# Patient Record
Sex: Male | Born: 1935 | Hispanic: Yes | Marital: Married | Smoking: Never smoker
Health system: Southern US, Community
[De-identification: ages and names within clinical notes are randomized; demographics above are authoritative.]

## PROBLEM LIST (undated history)

## (undated) DIAGNOSIS — I10 Essential (primary) hypertension: Secondary | ICD-10-CM

## (undated) DIAGNOSIS — F419 Anxiety disorder, unspecified: Secondary | ICD-10-CM

## (undated) HISTORY — PX: NO PAST SURGERIES: SHX2092

---

## 2018-08-29 ENCOUNTER — Encounter (HOSPITAL_COMMUNITY): Payer: Self-pay | Admitting: Emergency Medicine

## 2018-08-29 ENCOUNTER — Other Ambulatory Visit: Payer: Self-pay

## 2018-08-29 ENCOUNTER — Inpatient Hospital Stay (HOSPITAL_COMMUNITY)
Admission: EM | Admit: 2018-08-29 | Discharge: 2018-09-25 | DRG: 177 | Disposition: E | Payer: Medicaid Other | Attending: Internal Medicine | Admitting: Internal Medicine

## 2018-08-29 ENCOUNTER — Emergency Department (HOSPITAL_COMMUNITY): Payer: Medicaid Other

## 2018-08-29 DIAGNOSIS — I480 Paroxysmal atrial fibrillation: Secondary | ICD-10-CM | POA: Diagnosis present

## 2018-08-29 DIAGNOSIS — U071 COVID-19: Secondary | ICD-10-CM | POA: Diagnosis present

## 2018-08-29 DIAGNOSIS — J9601 Acute respiratory failure with hypoxia: Secondary | ICD-10-CM | POA: Diagnosis present

## 2018-08-29 DIAGNOSIS — J1289 Other viral pneumonia: Secondary | ICD-10-CM | POA: Diagnosis not present

## 2018-08-29 DIAGNOSIS — I10 Essential (primary) hypertension: Secondary | ICD-10-CM | POA: Diagnosis present

## 2018-08-29 DIAGNOSIS — E785 Hyperlipidemia, unspecified: Secondary | ICD-10-CM | POA: Diagnosis present

## 2018-08-29 DIAGNOSIS — R509 Fever, unspecified: Secondary | ICD-10-CM | POA: Diagnosis present

## 2018-08-29 DIAGNOSIS — R069 Unspecified abnormalities of breathing: Secondary | ICD-10-CM

## 2018-08-29 DIAGNOSIS — I959 Hypotension, unspecified: Secondary | ICD-10-CM | POA: Diagnosis not present

## 2018-08-29 DIAGNOSIS — R0902 Hypoxemia: Secondary | ICD-10-CM | POA: Diagnosis not present

## 2018-08-29 DIAGNOSIS — I469 Cardiac arrest, cause unspecified: Secondary | ICD-10-CM | POA: Diagnosis not present

## 2018-08-29 DIAGNOSIS — R0603 Acute respiratory distress: Secondary | ICD-10-CM | POA: Diagnosis present

## 2018-08-29 DIAGNOSIS — R0602 Shortness of breath: Secondary | ICD-10-CM

## 2018-08-29 HISTORY — DX: Anxiety disorder, unspecified: F41.9

## 2018-08-29 HISTORY — DX: Essential (primary) hypertension: I10

## 2018-08-29 LAB — CBC WITH DIFFERENTIAL/PLATELET
Abs Immature Granulocytes: 0.02 10*3/uL (ref 0.00–0.07)
Basophils Absolute: 0 10*3/uL (ref 0.0–0.1)
Basophils Relative: 0 %
Eosinophils Absolute: 0 10*3/uL (ref 0.0–0.5)
Eosinophils Relative: 0 %
HCT: 39 % (ref 39.0–52.0)
Hemoglobin: 13.3 g/dL (ref 13.0–17.0)
Immature Granulocytes: 0 %
Lymphocytes Relative: 13 %
Lymphs Abs: 1.1 10*3/uL (ref 0.7–4.0)
MCH: 32.4 pg (ref 26.0–34.0)
MCHC: 34.1 g/dL (ref 30.0–36.0)
MCV: 94.9 fL (ref 80.0–100.0)
Monocytes Absolute: 0.5 10*3/uL (ref 0.1–1.0)
Monocytes Relative: 6 %
Neutro Abs: 6.6 10*3/uL (ref 1.7–7.7)
Neutrophils Relative %: 81 %
Platelets: 161 10*3/uL (ref 150–400)
RBC: 4.11 MIL/uL — ABNORMAL LOW (ref 4.22–5.81)
RDW: 13.4 % (ref 11.5–15.5)
WBC: 8.3 10*3/uL (ref 4.0–10.5)
nRBC: 0 % (ref 0.0–0.2)

## 2018-08-29 LAB — URINALYSIS, ROUTINE W REFLEX MICROSCOPIC
Bacteria, UA: NONE SEEN
Bilirubin Urine: NEGATIVE
Glucose, UA: NEGATIVE mg/dL
Ketones, ur: NEGATIVE mg/dL
Leukocytes,Ua: NEGATIVE
Nitrite: NEGATIVE
Protein, ur: 30 mg/dL — AB
Specific Gravity, Urine: 1.014 (ref 1.005–1.030)
pH: 7 (ref 5.0–8.0)

## 2018-08-29 LAB — SARS CORONAVIRUS 2 BY RT PCR (HOSPITAL ORDER, PERFORMED IN ~~LOC~~ HOSPITAL LAB): SARS Coronavirus 2: POSITIVE — AB

## 2018-08-29 LAB — COMPREHENSIVE METABOLIC PANEL
ALT: 35 U/L (ref 0–44)
AST: 44 U/L — ABNORMAL HIGH (ref 15–41)
Albumin: 3.7 g/dL (ref 3.5–5.0)
Alkaline Phosphatase: 56 U/L (ref 38–126)
Anion gap: 9 (ref 5–15)
BUN: 9 mg/dL (ref 8–23)
CO2: 23 mmol/L (ref 22–32)
Calcium: 8 mg/dL — ABNORMAL LOW (ref 8.9–10.3)
Chloride: 103 mmol/L (ref 98–111)
Creatinine, Ser: 0.95 mg/dL (ref 0.61–1.24)
GFR calc Af Amer: >60 mL/min (ref >60)
GFR calc non Af Amer: >60 mL/min (ref >60)
Glucose, Bld: 128 mg/dL — ABNORMAL HIGH (ref 70–99)
Potassium: 3.8 mmol/L (ref 3.5–5.1)
Sodium: 135 mmol/L (ref 135–145)
Total Bilirubin: 0.3 mg/dL (ref 0.3–1.2)
Total Protein: 8.1 g/dL (ref 6.5–8.1)

## 2018-08-29 LAB — PROCALCITONIN: Procalcitonin: <0.1 ng/mL

## 2018-08-29 LAB — FERRITIN: Ferritin: 420 ng/mL — ABNORMAL HIGH (ref 24–336)

## 2018-08-29 LAB — D-DIMER, QUANTITATIVE: D-Dimer, Quant: 0.7 ug/mL-FEU — ABNORMAL HIGH (ref 0.00–0.50)

## 2018-08-29 LAB — FIBRINOGEN: Fibrinogen: 731 mg/dL — ABNORMAL HIGH (ref 210–475)

## 2018-08-29 LAB — GLUCOSE, CAPILLARY
Glucose-Capillary: 99 mg/dL (ref 70–99)
Glucose-Capillary: 90 mg/dL (ref 70–99)

## 2018-08-29 LAB — LACTIC ACID, PLASMA: Lactic Acid, Venous: 1.2 mmol/L (ref 0.5–1.9)

## 2018-08-29 LAB — LACTATE DEHYDROGENASE: LDH: 223 U/L — ABNORMAL HIGH (ref 98–192)

## 2018-08-29 LAB — C-REACTIVE PROTEIN: CRP: 16 mg/dL — ABNORMAL HIGH (ref <1.0)

## 2018-08-29 LAB — TRIGLYCERIDES: Triglycerides: 122 mg/dL (ref <150)

## 2018-08-29 MED ORDER — SODIUM CHLORIDE 0.9 % IV SOLN
INTRAVENOUS | Status: AC
  Administered 2018-08-29 – 2018-08-30 (×2): via INTRAVENOUS

## 2018-08-29 MED ORDER — HYDROCODONE-ACETAMINOPHEN 5-325 MG PO TABS
1.0000 | ORAL_TABLET | ORAL | Status: DC | PRN
  Administered 2018-08-29: 18:00:00 1 via ORAL
  Administered 2018-08-31: 22:00:00 2 via ORAL
  Administered 2018-09-02 – 2018-09-03 (×2): 1 via ORAL
  Filled 2018-08-29 (×5): qty 1

## 2018-08-29 MED ORDER — ACETAMINOPHEN 325 MG PO TABS
650.0000 mg | ORAL_TABLET | Freq: Four times a day (QID) | ORAL | Status: DC | PRN
  Administered 2018-08-29 – 2018-09-02 (×7): 650 mg via ORAL
  Filled 2018-08-29 (×7): qty 2

## 2018-08-29 MED ORDER — GUAIFENESIN-DM 100-10 MG/5ML PO SYRP
10.0000 mL | ORAL_SOLUTION | Freq: Three times a day (TID) | ORAL | Status: DC | PRN
  Administered 2018-08-29 – 2018-09-01 (×4): 10 mL via ORAL
  Filled 2018-08-29 (×4): qty 10

## 2018-08-29 MED ORDER — ENOXAPARIN SODIUM 40 MG/0.4ML ~~LOC~~ SOLN: 40.0000 mg | SUBCUTANEOUS | Status: DC

## 2018-08-29 MED ORDER — SENNOSIDES-DOCUSATE SODIUM 8.6-50 MG PO TABS: 1.0000 | ORAL_TABLET | Freq: Every evening | ORAL | Status: DC | PRN

## 2018-08-29 MED ORDER — ONDANSETRON HCL 4 MG/2ML IJ SOLN
4.0000 mg | Freq: Four times a day (QID) | INTRAMUSCULAR | Status: DC | PRN
  Administered 2018-08-31: 22:00:00 4 mg via INTRAVENOUS
  Filled 2018-08-29 (×2): qty 2

## 2018-08-29 MED ORDER — IPRATROPIUM BROMIDE HFA 17 MCG/ACT IN AERS
2.0000 | INHALATION_SPRAY | Freq: Four times a day (QID) | RESPIRATORY_TRACT | Status: DC | PRN
  Administered 2018-08-29 – 2018-08-31 (×2): 2 via RESPIRATORY_TRACT
  Filled 2018-08-29: qty 12.9

## 2018-08-29 MED ORDER — TOCILIZUMAB 400 MG/20ML IV SOLN
8.0000 mg/kg | Freq: Once | INTRAVENOUS | Status: AC
  Administered 2018-08-29: 19:00:00 552 mg via INTRAVENOUS
  Filled 2018-08-29: qty 20

## 2018-08-29 MED ORDER — INSULIN ASPART 100 UNIT/ML ~~LOC~~ SOLN: 0.0000 [IU] | Freq: Every day | SUBCUTANEOUS | Status: DC

## 2018-08-29 MED ORDER — INSULIN ASPART 100 UNIT/ML ~~LOC~~ SOLN
0.0000 [IU] | Freq: Three times a day (TID) | SUBCUTANEOUS | Status: DC
  Administered 2018-08-30 – 2018-08-31 (×2): 1 [IU] via SUBCUTANEOUS
  Administered 2018-09-01 (×3): 2 [IU] via SUBCUTANEOUS
  Administered 2018-09-02 – 2018-09-03 (×6): 1 [IU] via SUBCUTANEOUS

## 2018-08-29 MED ORDER — IPRATROPIUM BROMIDE HFA 17 MCG/ACT IN AERS
2.0000 | INHALATION_SPRAY | Freq: Four times a day (QID) | RESPIRATORY_TRACT | Status: DC
  Filled 2018-08-29: qty 12.9

## 2018-08-29 MED ORDER — ACETAMINOPHEN 500 MG PO TABS
1000.0000 mg | ORAL_TABLET | Freq: Once | ORAL | Status: AC
  Administered 2018-08-29: 11:00:00 1000 mg via ORAL
  Filled 2018-08-29: qty 2

## 2018-08-29 MED ORDER — BISACODYL 5 MG PO TBEC: 5.0000 mg | DELAYED_RELEASE_TABLET | Freq: Every day | ORAL | Status: DC | PRN

## 2018-08-29 MED ORDER — SODIUM CHLORIDE 0.9 % IV BOLUS
1000.0000 mL | Freq: Once | INTRAVENOUS | Status: AC
  Administered 2018-08-29: 1000 mL via INTRAVENOUS

## 2018-08-29 MED ORDER — ONDANSETRON HCL 4 MG PO TABS
4.0000 mg | ORAL_TABLET | Freq: Four times a day (QID) | ORAL | Status: DC | PRN
  Administered 2018-09-02 (×2): 4 mg via ORAL
  Filled 2018-08-29: qty 1

## 2018-08-29 NOTE — ED Notes (Signed)
[facility] has been dispatched.  

## 2018-08-29 NOTE — H&P (Signed)
History and Physical    Craig Dorsey JEH:[id number] DOB: 01-Jan-1933 DOA: 09/14/2018  PCP: Patient, [name] Patient coming from: Home  Chief Complaint: Body aches  HPI: Craig Dorsey is a 85 y.o. male with medical history significant of essential hypertension, hyperlipidemia came to the hospital with complains of body aches and chills.  Patient is a Spanish speaker visiting her daughter from [place].  Difficult to obtain history from the patient as he is a very poor historian.  Using the interpreter most of the history was obtained from his daughter. Patient had is reporting of body aches and subjective fevers and chills for the past 7 days and his wife started experiencing similar symptoms last Thursday as well.  Due to persistence of the symptoms they came to the ER for evaluation.  Patient currently lives with his daughter while visiting.  Denies any sick contacts or other complaints. In the ER most of his routine labs were unremarkable.  Inflammatory markers were slightly elevated.EKG was unremarkable.  He was hypoxic saturating as low as 85% on room air.  Chest x-ray showed bilateral infiltrate/opacity.  COVID-19-positive.  It was determined to admit him for further care and management.    Review of Systems: As per HPI otherwise 10 point review of systems negative.  Review of Systems Otherwise negative except as per HPI, including: General: Denies  night sweats or unintended weight loss. Resp: Denies cough, wheezing, shortness of breath. Cardiac: Denies chest pain, palpitations, orthopnea, paroxysmal nocturnal dyspnea. GI: Denies abdominal pain, nausea, vomiting, diarrhea or constipation GU: Denies dysuria, frequency, hesitancy or incontinence MS: Denies muscle aches, joint pain or swelling Neuro: Denies headache, neurologic deficits (focal weakness, numbness, tingling), abnormal gait Psych: Denies anxiety, depression, SI/HI/AVH Skin: Denies new rashes or lesions ID:  Denies sick contacts, exotic exposures, travel  Past Medical History:  Diagnosis Date  . Anxiety   . Hypertension     History reviewed. No pertinent surgical history.  SOCIAL HISTORY:  reports that he has never smoked. He has never used smokeless tobacco. He reports that he does not drink alcohol or use drugs.  No Known Allergies  FAMILY HISTORY: History reviewed. No pertinent family history.   Prior to Admission medications   Not on File    Physical Exam: Vitals:   09/09/2018 1345 09/01/2018 1348 09/24/2018 1401 09/14/2018 1430  BP:   134/70 125/72  Pulse:  81 78 76  Resp:  (!) 25 (!) 25 (!) 29  Temp:      TempSrc:      SpO2: 95% 96% 96% 94%      Constitutional: NAD, calm, comfortable Eyes: PERRL, lids and conjunctivae normal ENMT: Mucous membranes are moist. Posterior pharynx clear of any exudate or lesions.Normal dentition.  Neck: normal, supple, no masses, no thyromegaly Respiratory: Bilateral coarse BS. No resp muscles used.  Cardiovascular: Regular rate and rhythm, no murmurs / rubs / gallops. No extremity edema. 2+ pedal pulses. No carotid bruits.  Abdomen: no tenderness, no masses palpated. No hepatosplenomegaly. Bowel sounds positive.  Musculoskeletal: Lorra Hals moves all the extremities.  Skin: no rashes, lesions, ulcers. No induration Neurologic: difficult to perform. Doesn't answer all the questions.  Psychiatric: AAox1    Labs on Admission: I have personally reviewed following labs and imaging studies  CBC: Recent Labs  Lab 09/22/2018 1114  WBC 8.3  NEUTROABS 6.6  HGB 13.3  HCT 39.0  MCV 94.9  PLT 161   Basic Metabolic Panel: Recent Labs  Lab 09/11/2018 1114  NA 135  K 3.8  CL 103  CO2 23  GLUCOSE 128*  BUN 9  CREATININE 0.95  CALCIUM 8.0*   GFR: CrCl cannot be calculated (Unknown ideal weight.). Liver Function Tests: Recent Labs  Lab 2019-04-11 1114  AST 44*  ALT 35  ALKPHOS 56  BILITOT 0.3  PROT 8.1  ALBUMIN 3.7   No results for  input(s): LIPASE, AMYLASE in the last 168 hours. No results for input(s): AMMONIA in the last 168 hours. Coagulation Profile: No results for input(s): INR, PROTIME in the last 168 hours. Cardiac Enzymes: No results for input(s): CKTOTAL, CKMB, CKMBINDEX, TROPONINI in the last 168 hours. BNP (last 3 results) No results for input(s): PROBNP in the last 8760 hours. HbA1C: No results for input(s): HGBA1C in the last 72 hours. CBG: No results for input(s): GLUCAP in the last 168 hours. Lipid Profile: Recent Labs    2019-04-11 1114  TRIG 122   Thyroid Function Tests: No results for input(s): TSH, T4TOTAL, FREET4, T3FREE, THYROIDAB in the last 72 hours. Anemia Panel: Recent Labs    2019-04-11 1114  FERRITIN 420*   Urine analysis: No results found for: COLORURINE, APPEARANCEUR, LABSPEC, PHURINE, GLUCOSEU, HGBUR, BILIRUBINUR, KETONESUR, PROTEINUR, UROBILINOGEN, NITRITE, LEUKOCYTESUR Sepsis Labs: !!!!!!!!!!!!!!!!!!!!!!!!!!!!!!!!!!!!!!!!!!!! @LABRCNTIP (procalcitonin:4,lacticidven:4) )No results found for this or any previous visit (from the past 240 hour(s)).   Radiological Exams on Admission: Dg Chest Port 1 View  Result Date: 09/21/2018 CLINICAL DATA:  Body aches since Wednesday EXAM: PORTABLE CHEST 1 VIEW COMPARISON:  None. FINDINGS: Bilateral airspace opacity, likely atypical infection in this setting. There are likely calcified pulmonary nodules and hilar lymph nodes on the right. Mild cardiomegaly. No effusion or pneumothorax. IMPRESSION: Bilateral infiltrate primarily concerning for atypical infection. Electronically Signed   By: Marnee SpringJonathon  Watts M.D.   On: 2020-03-1619 11:40     All images have been reviewed by me personally.  EKG: Independently reviewed. qtc - ok  COVID-19 Labs  Recent Labs    2019-04-11 1114  DDIMER 0.70*  FERRITIN 420*  LDH 223*  CRP 16.0*     Assessment/Plan Principal Problem:   Acute respiratory distress Active Problems:   COVID-19 virus infection    Acute Respiratory Distress with Hypoxia, on 2LNC COVID 19, infection. -Admit to [facility]; patient is requiring 2L [place] at this time.  -COVID labs mentioned above, follow up COVID labs daily.  -Inhalers prn, Incentive spirometry, supportive care. Incentive spirometer -Will give a dose of Actmera. Spoke extensive using interpreter to ensure they have approved use of off label drugs.   Essential HTN -Per daughter takes [place] at home prescribed from [place]. Will treat prn here.  Hyperlipidemia   -Takes Rotaqor in [place], Resume upon discharge.   Interpreter ID: [id number]  DVT prophylaxis: Lovenox Code Status: Full Code Family Communication: Spoke with Daughter, Craig DavenportSandra- used interpreter Line [facility] Plan[facility]  [facility] Consults called: None Admission status: Admit Inpatient   Time Spent: 65 minutes.  >50% of the time was devoted to discussing the patients care, assessment, plan and disposition with other care givers along with counseling the patient about the risks and benefits of treatment.    Cabe Lashley Joline Maxcyhirag Aryn Kops MD [facility]  If 7PM-7AM, please contact night-coverage [email or web address]  09/03/2018, 3:05 PM

## 2018-08-29 NOTE — ED Notes (Signed)
[facility] at bedside 

## 2018-08-29 NOTE — ED Provider Notes (Signed)
[facility]-EMERGENCY DEPT Provider Note   CSN: [id number] Arrival date & time: 09/06/2018  1018    History   Chief Complaint Chief Complaint  Patient presents with  . Generalized Body Aches  . Fever    HPI Craig Dorsey is a 84 y.o. male.     85yo M w/ no known PMH who p/w fever and body aches. PT reports 6 days of fever associated w/ generalized body aches. He denies any chest pain or breathing problems. He has mild cough. No vomiting, diarrhea, or urinary symptoms. PT's wife is currently in ED being evaluated for similar symptoms that began at the same time. They have been in [place] from [place] since February with no recent travel or other known sick contacts. They live with their daughters who are currently well.   The history is provided by the patient. The history is limited by a language barrier. A language interpreter was used.  Fever    History reviewed. No pertinent past medical history.  There are no active problems to display for this patient.   History reviewed. No pertinent surgical history.      Home Medications    Prior to Admission medications   Not on File    Family History No family history on file.  Social History Social History   Tobacco Use  . Smoking status: Never Smoker  . Smokeless tobacco: Never Used  Substance Use Topics  . Alcohol use: Not on file  . Drug use: Not on file     Allergies   Patient has no known allergies.   Review of Systems Review of Systems  Constitutional: Positive for fever.   All other systems reviewed and are negative except that which was mentioned in HPI   Physical Exam Updated Vital Signs BP (!) 167/80 (BP Location: Right Arm)   Pulse 94   Temp (!) 101.6 F (38.7 C) (Oral)   Resp 18   SpO2 91%   Physical Exam Vitals signs and nursing note reviewed.  Constitutional:      General: He is not in acute distress.    Appearance: He is well-developed.  HENT:     Head:  Normocephalic and atraumatic.     Nose: Nose normal.     Mouth/Throat:     Mouth: Mucous membranes are moist.     Pharynx: Oropharynx is clear. No oropharyngeal exudate.  Eyes:     Conjunctiva/sclera: Conjunctivae normal.  Neck:     Musculoskeletal: Neck supple.  Cardiovascular:     Rate and Rhythm: Normal rate and regular rhythm.     Pulses: Normal pulses.  Pulmonary:     Comments: Tachypnea without respiratory distress, no accessory muscle use Abdominal:     General: There is no distension.     Palpations: Abdomen is soft.     Tenderness: There is no abdominal tenderness.  Musculoskeletal:     Right lower leg: No edema.     Left lower leg: No edema.  Skin:    General: Skin is warm and dry.  Neurological:     Mental Status: He is alert.     Comments: Fluent speech, occasionally doesn't answer questions but when prompted 2 or 3 times, provides short, appropriate answers  Psychiatric:     Comments: Calm, quiet, seems unfocused during conversation      ED Treatments / Results  Labs (all labs ordered are listed, but only abnormal results are displayed) Labs Reviewed  COMPREHENSIVE METABOLIC PANEL - Abnormal;  Notable for the following components:      Result Value   Glucose, Bld 128 (*)    Calcium 8.0 (*)    AST 44 (*)    All other components within normal limits  CBC WITH DIFFERENTIAL/PLATELET - Abnormal; Notable for the following components:   RBC 4.11 (*)    All other components within normal limits  D-DIMER, QUANTITATIVE (NOT AT [facility]) - Abnormal; Notable for the following components:   D-Dimer, Quant 0.70 (*)    All other components within normal limits  LACTATE DEHYDROGENASE - Abnormal; Notable for the following components:   LDH 223 (*)    All other components within normal limits  FERRITIN - Abnormal; Notable for the following components:   Ferritin 420 (*)    All other components within normal limits  FIBRINOGEN - Abnormal; Notable for the following  components:   Fibrinogen 731 (*)    All other components within normal limits  C-REACTIVE PROTEIN - Abnormal; Notable for the following components:   CRP 16.0 (*)    All other components within normal limits  CULTURE, BLOOD (ROUTINE X 2)  CULTURE, BLOOD (ROUTINE X 2)  URINE CULTURE  SARS CORONAVIRUS 2 (HOSPITAL ORDER, PERFORMED IN [facility])  LACTIC ACID, PLASMA  PROCALCITONIN  TRIGLYCERIDES  URINALYSIS, ROUTINE W REFLEX MICROSCOPIC    EKG None  Radiology Dg Chest Port 1 View  Result Date: 24-Sep-2018 CLINICAL DATA:  Body aches since Wednesday EXAM: PORTABLE CHEST 1 VIEW COMPARISON:  None. FINDINGS: Bilateral airspace opacity, likely atypical infection in this setting. There are likely calcified pulmonary nodules and hilar lymph nodes on the right. Mild cardiomegaly. No effusion or pneumothorax. IMPRESSION: Bilateral infiltrate primarily concerning for atypical infection. Electronically Signed   By: Marnee Spring M.D.   On: 09-24-18 11:40    Procedures Procedures (including critical care time)  Medications Ordered in ED Medications  acetaminophen (TYLENOL) tablet 1,000 mg (1,000 mg Oral Given Sep 24, 2018 1126)  sodium chloride 0.9 % bolus 1,000 mL (0 mLs Intravenous Stopped 24-Sep-2018 1407)     Initial Impression / Assessment and Plan / ED Course  I have reviewed the triage vital signs and the nursing notes.  Pertinent labs & imaging results that were available during my care of the patient were reviewed by me and considered in my medical decision making (see chart for details).       PT non-toxic on exam, tachypneic but without respiratory distress. T 101.6, BP 167/80, HR 90s, O2 sat 91% on RA. PT is non-smoker with no lung disease therefore placed on supplemental O2 for mild hypoxia. He seemed distant and unfocused during conversation through interpreter, difficult to assess whether actually confused or just language barrier. No overt AMS. Strong concern for COVID  19 given wife w/ symptoms and time course.   Labs show reassuring CMP and CBC, +COVID-19. Elevated CRP, fibrinogen. CXR shows b/l infiltrates c/w COVID 19 infection. His lactate has been normal, WBC normal, and BP/HR stable therefore I held off on antibiotics since his viral testing was positive. He has continued to have oxygen requirement and is in a very high risk category for continued respiratory failure given advanced age. Therefore discussed admission w/ [facility], Dr. Nelson Chimes, and pt will be admitted for further treatment. He and wife were updated on plan.   Craig Dorsey was evaluated in Emergency Department on 24-Sep-2018 for the symptoms described in the history of present illness. He was evaluated in the context of the global COVID-19 pandemic,  which necessitated consideration that the patient might be at risk for infection with the SARS-CoV-2 virus that causes COVID-19. Institutional protocols and algorithms that pertain to the evaluation of patients at risk for COVID-19 are in a state of rapid change based on information released by regulatory bodies including the CDC and federal and state organizations. These policies and algorithms were followed during the patient's care in the ED.  Final Clinical Impressions(s) / ED Diagnoses   Final diagnoses:  COVID-19 virus infection  Acute respiratory failure with hypoxia [facility](HCC)    ED Discharge Orders    None       Little, Ambrose Finlandachel [place], MD 09/01/2018 1439

## 2018-08-29 NOTE — ED Notes (Signed)
Patient made aware and updated of plan of care via [facility].   Bedside commode and urinal at bedside.  Patient encouraged to void for UA.   Call bell and phone placed at bedside.  Patient made aware to answer when it rings and to press call bell button for help.

## 2018-08-29 NOTE — ED Triage Notes (Signed)
Pt c/o body aches since Wed. Pt been in [place] since Feb from [place]

## 2018-08-29 NOTE — ED Notes (Signed)
ED TO INPATIENT HANDOFF REPORT  ED Nurse Name and Phone #: Morrie Sheldonshley [phone]  S Name/Age/Gender Craig Dorsey 85 y.o. male Room/Bed: WA12/WA12  Code Status   Code Status: Not on file  Home/SNF/Other Home Patient oriented to: self Is this baseline? limited assessment d/t pt not forthcoming with information when using interpetor service  Triage Complete: Triage complete  Chief Complaint Body pain   Triage Note Pt c/o body aches since Wed. [place] been in [place] since [place] from [place]    Allergies No Known Allergies  Level of Care/Admitting Diagnosis ED Disposition    ED Disposition Condition Comment   Admit  [facility] [[id number]]  Level of Care: Progressive [102]  Covid Evaluation: Confirmed COVID Positive  Isolation Risk Level: Low Risk/Droplet (Less than 4L [place] supplementation)  Diagnosis: Acute respiratory distress [[id number]][[id number]]  Admitting Physician: Stephania FragminMIN, ANKIT [facility] [[id number]]  Attending Physician: Stephania FragminMIN, ANKIT CHIRAG [[id number]]  Estimated length of stay: past midnight tomorrow  Certification:: I certify this patient will need inpatient services for at least 2 midnights  PT Class (Do Not Modify): Inpatient [101]  PT Acc Code (Do Not Modify): Private [1]       B Medical/Surgery History History reviewed. No pertinent past medical history. History reviewed. No pertinent surgical history.   A IV Location/Drains/Wounds Patient Lines/Drains/Airways Status   Active Line/Drains/Airways    Name:   Placement date:   Placement time:   Site:   Days:   Peripheral IV 08/25/2018 Right Antecubital   09/13/2018    1110    Antecubital   less than 1   Peripheral IV 09/21/2018 Left Antecubital   08/31/2018    1125    Antecubital   less than 1          Intake/Output Last 24 hours  Intake/Output Summary (Last 24 hours) at 08/30/2018 1432 Last data filed at 09/06/2018 1407 Gross per 24 hour  Intake 1000 ml  Output -  Net 1000 ml    Labs/Imaging Results  for orders placed or performed during the hospital encounter of 08/26/2018 (from the past 48 hour(s))  Lactic acid, plasma     Status: None   Collection Time: 09/22/2018 10:57 AM  Result Value Ref Range   Lactic Acid, Venous 1.2 0.5 - 1.9 mmol/L    Comment: Performed at [facility], [address] [place], [place] [address]  Comprehensive metabolic panel     Status: Abnormal   Collection Time: 08/27/2018 11:14 AM  Result Value Ref Range   Sodium 135 135 - 145 mmol/L   Potassium 3.8 3.5 - 5.1 mmol/L   Chloride 103 98 - 111 mmol/L   CO2 23 22 - 32 mmol/L   Glucose, Bld 128 (H) 70 - 99 mg/dL   BUN 9 8 - 23 mg/dL   Creatinine, Ser 4.130.95 0.61 - 1.24 mg/dL   Calcium 8.0 (L) 8.9 - 10.3 mg/dL   Total Protein 8.1 6.5 - 8.1 g/dL   Albumin 3.7 3.5 - 5.0 g/dL   AST 44 (H) 15 - 41 U/L   ALT 35 0 - 44 U/L   Alkaline Phosphatase 56 38 - 126 U/L   Total Bilirubin 0.3 0.3 - 1.2 mg/dL   GFR calc non Af Amer >60 >60 mL/min   GFR calc Af Amer >60 >60 mL/min   Anion gap 9 5 - 15    Comment: Performed at [facility], [address] [place], [place] [address]  CBC WITH DIFFERENTIAL     Status: Abnormal   Collection Time: 09/21/2018 11:14 AM  Result Value Ref Range   WBC 8.3 4.0 - 10.5 K/uL   RBC 4.11 (L) 4.22 - 5.81 MIL/uL   Hemoglobin 13.3 13.0 - 17.0 g/dL   HCT 95.6 38.7 - 56.4 %   MCV 94.9 80.0 - 100.0 fL   MCH 32.4 26.0 - 34.0 pg   MCHC 34.1 30.0 - 36.0 g/dL   RDW 33.2 95.1 - 88.4 %   Platelets 161 150 - 400 K/uL   nRBC 0.0 0.0 - 0.2 %   Neutrophils Relative % 81 %   Neutro Abs 6.6 1.7 - 7.7 K/uL   Lymphocytes Relative 13 %   Lymphs Abs 1.1 0.7 - 4.0 K/uL   Monocytes Relative 6 %   Monocytes Absolute 0.5 0.1 - 1.0 K/uL   Eosinophils Relative 0 %   Eosinophils Absolute 0.0 0.0 - 0.5 K/uL   Basophils Relative 0 %   Basophils Absolute 0.0 0.0 - 0.1 K/uL   Immature Granulocytes 0 %   Abs Immature Granulocytes 0.02 0.00 - 0.07 K/uL    Comment: Performed at  [facility], [address] [place], [place] [address]  D-dimer, quantitative     Status: Abnormal   Collection Time: 08/27/2018 11:14 AM  Result Value Ref Range   D-Dimer, Quant 0.70 (H) 0.00 - 0.50 ug/mL-FEU    Comment: (NOTE) At the manufacturer cut-off of 0.50 ug/mL FEU, this assay has been documented to exclude PE with a sensitivity and negative predictive value of 97 to 99%.  At this time, this assay has not been approved by the FDA to exclude DVT/VTE. Results should be correlated with clinical presentation. Performed at [facility], [address] [place], [place] [address]   Procalcitonin     Status: None   Collection Time: 09/24/2018 11:14 AM  Result Value Ref Range   Procalcitonin <0.10 ng/mL    Comment:        Interpretation: PCT (Procalcitonin) <= 0.5 ng/mL: Systemic infection (sepsis) is not likely. Local bacterial infection is possible. (NOTE)       Sepsis PCT Algorithm           Lower Respiratory Tract                                      Infection PCT Algorithm    ----------------------------     ----------------------------         PCT < 0.25 ng/mL                PCT < 0.10 ng/mL         Strongly encourage             Strongly discourage   discontinuation of antibiotics    initiation of antibiotics    ----------------------------     -----------------------------       PCT 0.25 - 0.50 ng/mL            PCT 0.10 - 0.25 ng/mL               OR       >80% decrease in PCT            Discourage initiation of  antibiotics      Encourage discontinuation           of antibiotics    ----------------------------     -----------------------------         PCT >= 0.50 ng/mL              PCT 0.26 - 0.50 ng/mL               AND        <80% decrease in PCT             Encourage initiation of                                             antibiotics       Encourage continuation           of  antibiotics    ----------------------------     -----------------------------        PCT >= 0.50 ng/mL                  PCT > 0.50 ng/mL               AND         increase in PCT                  Strongly encourage                                      initiation of antibiotics    Strongly encourage escalation           of antibiotics                                     -----------------------------                                           PCT <= 0.25 ng/mL                                                 OR                                        > 80% decrease in PCT                                     Discontinue / Do not initiate                                             antibiotics Performed at [facility], [address] [place], [place] [address]   Lactate dehydrogenase     Status: Abnormal   Collection Time: 09/17/2018  11:14 AM  Result Value Ref Range   LDH 223 (H) 98 - 192 U/L    Comment: Performed at [facility], [address], [place], [place] [address]  Ferritin     Status: Abnormal   Collection Time: 09/18/2018 11:14 AM  Result Value Ref Range   Ferritin 420 (H) 24 - 336 ng/mL    Comment: Performed at [facility], [address], [place], [place] [address]  Triglycerides     Status: None   Collection Time: 09/24/2018 11:14 AM  Result Value Ref Range   Triglycerides 122 <150 mg/dL    Comment: Performed at [facility], [address], [place], [place] [address]  Fibrinogen     Status: Abnormal   Collection Time: 09/21/2018 11:14 AM  Result Value Ref Range   Fibrinogen 731 (H) 210 - 475 mg/dL    Comment: Performed at [facility], [address], [place], [place] [address]  C-reactive protein     Status: Abnormal   Collection Time: 09/05/2018 11:14 AM  Result Value Ref Range   CRP 16.0 (H) <1.0 mg/dL    Comment: Performed at [facility], [address], [place], [place] [address]   Dg Chest Port 1 View  Result Date: 09/03/2018 CLINICAL DATA:  Body aches since Wednesday EXAM: PORTABLE CHEST 1 VIEW COMPARISON:  None. FINDINGS: Bilateral airspace opacity, likely atypical infection in this setting. There are likely calcified pulmonary nodules and hilar lymph nodes on the right. Mild cardiomegaly. No effusion or pneumothorax. IMPRESSION: Bilateral infiltrate primarily concerning for atypical infection. Electronically Signed   By: Marnee Spring M.D.   On: 09/24/2018 11:40    Pending Labs Unresulted Labs (From admission, onward)    Start     Ordered   09/08/2018 1058  SARS Coronavirus 2 (CEPHEID- Performed in [facility] hospital lab), Hosp Order  (Symptomatic Patients Labs with Precautions )  Once,   R    Question:  Patient immune status  Answer:  Normal   08/27/2018 1057   08/25/2018 1057  Blood Culture (routine x 2)  BLOOD CULTURE X 2,   STAT     08/30/2018 1057   09/13/2018 1057  Urinalysis, Routine w reflex microscopic  ONCE - STAT,   STAT     09/22/2018 1057   09/06/2018 1057  Urine culture  ONCE - STAT,   STAT     09/22/2018 1057          Vitals/Pain Today's Vitals   08/31/2018 1330 09/11/2018 1345 09/05/2018 1348 09/01/2018 1401  BP: (!) 184/85   134/70  Pulse: (!) 105  81 78  Resp: (!) 25  (!) 25 (!) 25  Temp:      TempSrc:      SpO2: 96% 95% 96% 96%    Isolation Precautions Droplet and Contact precautions  Medications Medications  acetaminophen (TYLENOL) tablet 1,000 mg (1,000 mg Oral Given 09/05/2018 1126)  sodium chloride 0.9 % bolus 1,000 mL (0 mLs Intravenous Stopped 08/27/2018 1407)    Mobility walks Moderate fall risk   Focused Assessments Pulmonary Assessment Handoff:  Lung sounds:   O2 Device: Nasal Cannula O2 Flow Rate (L/min): 1 L/min      R Recommendations: See Admitting Provider Note  Report given to:   Additional Notes: see progress noted

## 2018-08-29 NOTE — ED Notes (Signed)
X-ray at bedside

## 2018-08-30 DIAGNOSIS — I10 Essential (primary) hypertension: Secondary | ICD-10-CM

## 2018-08-30 DIAGNOSIS — J9601 Acute respiratory failure with hypoxia: Secondary | ICD-10-CM

## 2018-08-30 LAB — COMPREHENSIVE METABOLIC PANEL
ALT: 28 U/L (ref 0–44)
AST: 43 U/L — ABNORMAL HIGH (ref 15–41)
Albumin: 3.3 g/dL — ABNORMAL LOW (ref 3.5–5.0)
Alkaline Phosphatase: 49 U/L (ref 38–126)
Anion gap: 10 (ref 5–15)
BUN: 10 mg/dL (ref 8–23)
CO2: 23 mmol/L (ref 22–32)
Calcium: 7.5 mg/dL — ABNORMAL LOW (ref 8.9–10.3)
Chloride: 105 mmol/L (ref 98–111)
Creatinine, Ser: 1 mg/dL (ref 0.61–1.24)
GFR calc Af Amer: >60 mL/min (ref >60)
GFR calc non Af Amer: >60 mL/min (ref >60)
Glucose, Bld: 119 mg/dL — ABNORMAL HIGH (ref 70–99)
Potassium: 4 mmol/L (ref 3.5–5.1)
Sodium: 138 mmol/L (ref 135–145)
Total Bilirubin: 0.6 mg/dL (ref 0.3–1.2)
Total Protein: 7.6 g/dL (ref 6.5–8.1)

## 2018-08-30 LAB — CBC WITH DIFFERENTIAL/PLATELET
Abs Immature Granulocytes: 0.01 10*3/uL (ref 0.00–0.07)
Basophils Absolute: 0 10*3/uL (ref 0.0–0.1)
Basophils Relative: 0 %
Eosinophils Absolute: 0 10*3/uL (ref 0.0–0.5)
Eosinophils Relative: 0 %
HCT: 35.1 % — ABNORMAL LOW (ref 39.0–52.0)
Hemoglobin: 11.5 g/dL — ABNORMAL LOW (ref 13.0–17.0)
Immature Granulocytes: 0 %
Lymphocytes Relative: 19 %
Lymphs Abs: 1.4 10*3/uL (ref 0.7–4.0)
MCH: 31.4 pg (ref 26.0–34.0)
MCHC: 32.8 g/dL (ref 30.0–36.0)
MCV: 95.9 fL (ref 80.0–100.0)
Monocytes Absolute: 0.6 10*3/uL (ref 0.1–1.0)
Monocytes Relative: 9 %
Neutro Abs: 5.4 10*3/uL (ref 1.7–7.7)
Neutrophils Relative %: 72 %
Platelets: 170 10*3/uL (ref 150–400)
RBC: 3.66 MIL/uL — ABNORMAL LOW (ref 4.22–5.81)
RDW: 13.5 % (ref 11.5–15.5)
WBC: 7.4 10*3/uL (ref 4.0–10.5)
nRBC: 0 % (ref 0.0–0.2)

## 2018-08-30 LAB — GLUCOSE, CAPILLARY
Glucose-Capillary: 130 mg/dL — ABNORMAL HIGH (ref 70–99)
Glucose-Capillary: 111 mg/dL — ABNORMAL HIGH (ref 70–99)
Glucose-Capillary: 96 mg/dL (ref 70–99)
Glucose-Capillary: 140 mg/dL — ABNORMAL HIGH (ref 70–99)

## 2018-08-30 LAB — FERRITIN: Ferritin: 418 ng/mL — ABNORMAL HIGH (ref 24–336)

## 2018-08-30 LAB — BRAIN NATRIURETIC PEPTIDE: B Natriuretic Peptide: 157 pg/mL — ABNORMAL HIGH (ref 0.0–100.0)

## 2018-08-30 LAB — MAGNESIUM: Magnesium: 1.9 mg/dL (ref 1.7–2.4)

## 2018-08-30 LAB — C-REACTIVE PROTEIN: CRP: 19.1 mg/dL — ABNORMAL HIGH (ref <1.0)

## 2018-08-30 LAB — D-DIMER, QUANTITATIVE: D-Dimer, Quant: 0.9 ug/mL-FEU — ABNORMAL HIGH (ref 0.00–0.50)

## 2018-08-30 MED ORDER — TOCILIZUMAB 200 MG/10ML IV SOLN
8.0000 mg/kg | Freq: Once | INTRAVENOUS | Status: AC
  Administered 2018-08-30: 11:00:00 552 mg via INTRAVENOUS
  Filled 2018-08-30: qty 27.6

## 2018-08-30 MED ORDER — PHENOL 1.4 % MT LIQD
1.0000 | OROMUCOSAL | Status: DC | PRN
  Administered 2018-08-30: 20:00:00 1 via OROMUCOSAL
  Filled 2018-08-30 (×2): qty 177

## 2018-08-30 MED ORDER — ENOXAPARIN SODIUM 40 MG/0.4ML ~~LOC~~ SOLN
40.0000 mg | SUBCUTANEOUS | Status: DC
  Administered 2018-08-30: 20:00:00 40 mg via SUBCUTANEOUS
  Filled 2018-08-30: qty 0.4

## 2018-08-30 NOTE — Progress Notes (Signed)
Ok to add lovenox 40mg  back for DVT px.   Ulyses Southward, PharmD, BCIDP, AAHIVP, CPP Infectious Disease Pharmacist 08/30/2018 6:20 PM

## 2018-08-30 NOTE — Progress Notes (Addendum)
PROGRESS NOTE        PATIENT DETAILS Name: Craig Dorsey Age: 85 y.o. Sex: male Date of Birth: Dec 03, 1932 Admit Date: 09/03/2018 Admitting Physician Ankit Joline Maxcy, MD ZOX:[id number], [name]  Brief Narrative: Patient is a 85 y.o. male with history of HTN, dyslipidemia-presented with myalgias, fever cough and shortness of breath-was found to have acute hypoxic respiratory failure secondary to COVID-19 pneumonia.  Subjective: Feels a bit better-still coughing-afebrile last night.  Is on 4 -5 L of oxygen via nasal cannula to maintain O2 saturations above 90.  Patient was seen with a Radiation protection practitioner.  Assessment/Plan: Acute Hypoxic respiratory failure secondary to COVID-19 pneumonia: Still febrile overnight-on 4-5 L of oxygen via [place] to maintain O2 sats above 90.  Received Actemra yesterday-we will repeat another dose today.  Continue supportive care-we will encourage prone position for at least 16 hours a day.    The treatment plan,use of Actemra and its known side effects were discussed with patient/family, they were clearly explained that there is no proven definitive treatment for COVID-19 infection, any medications used here are based on published clinical articles/anecdotal data which were not obtained by randomized controlled trials.  Complete risks and long-term side effects are unknown, however in the current scenario of ongoing patient deterioration,the clinical judgment is that the benefit may outweigh the risks. Patient/family agree with the treatment plan and consent to receive the recommended medications.  HTN: Blood pressure remains reasonably stable without the use of any antihypertensives-follow for now.  Dyslipidemia: Resume statins on discharge  DVT Prophylaxis: Prophylactic Lovenox   Code Status: Full code   Family Communication: None at bedside  Disposition Plan: Remain inpatient-spoke with daughter over the phone  Antimicrobial  agents: Anti-infectives (From admission, onward)   None      Procedures: None  CONSULTS:  None  Time spent: 35 minutes-Greater than 50% of this time was spent in counseling, explanation of diagnosis, planning of further management, and coordination of care.  MEDICATIONS: Scheduled Meds: . insulin aspart  0-5 Units Subcutaneous QHS  . insulin aspart  0-9 Units Subcutaneous TID WC   Continuous Infusions: . sodium chloride 75 mL/hr at 08/30/18 0438  . tocilizumab (ACTEMRA) IV 552 mg (08/30/18 1104)   PRN Meds:.acetaminophen, bisacodyl, guaiFENesin-dextromethorphan, HYDROcodone-acetaminophen, ipratropium, ondansetron **OR** ondansetron (ZOFRAN) IV, phenol, senna-docusate   PHYSICAL EXAM: Vital signs: Vitals:   08/30/18 0429 08/30/18 0600 08/30/18 0700 08/30/18 0706  BP:    (!) 142/92  Pulse:    77  Resp:    (!) 21  Temp:  98.5 F (36.9 C)  98.5 F (36.9 C)  TempSrc:  Oral  Axillary  SpO2: (!) 87%  91% 92%  Weight:      Height:       Filed Weights   2018/09/03 1600  Weight: 69.1 kg   Body mass index is 23.91 kg/m.   General appearance :Awake, alert, not in any distress. HEENT: Atraumatic and Normocephalic Neck: supple, no JVD. No cervical lymphadenopathy. No thyromegaly Resp:Good air entry bilaterally, bibasilar rales CVS: S1 S2 regular, no murmurs.  GI: Bowel sounds present, Non tender and not distended with no gaurding, rigidity or rebound.No organomegaly Extremities: B/L Lower Ext shows no edema, both legs are warm to touch Neurology:  speech clear,Non focal, sensation is grossly intact. Psychiatric: Normal judgment and insight. Alert and oriented x 3. Normal mood.  Musculoskeletal:No digital cyanosis Skin:No Rash, warm and dry Wounds:N/A  I have personally reviewed following labs and imaging studies  LABORATORY DATA: CBC: Recent Labs  Lab 09-02-2018 1114 08/30/18 0600  WBC 8.3 7.4  NEUTROABS 6.6 5.4  HGB 13.3 11.5*  HCT 39.0 35.1*  MCV 94.9 95.9   PLT 161 170    Basic Metabolic Panel: Recent Labs  Lab 2018/09/02 1114 08/30/18 0600  NA 135 138  K 3.8 4.0  CL 103 105  CO2 23 23  GLUCOSE 128* 119*  BUN 9 10  CREATININE 0.95 1.00  CALCIUM 8.0* 7.5*  MG  --  1.9    GFR: Estimated Creatinine Clearance: 51.3 mL/min (by C-G formula based on SCr of 1 mg/dL).  Liver Function Tests: Recent Labs  Lab 2018/09/02 1114 08/30/18 0600  AST 44* 43*  ALT 35 28  ALKPHOS 56 49  BILITOT 0.3 0.6  PROT 8.1 7.6  ALBUMIN 3.7 3.3*   No results for input(s): LIPASE, AMYLASE in the last 168 hours. No results for input(s): AMMONIA in the last 168 hours.  Coagulation Profile: No results for input(s): INR, PROTIME in the last 168 hours.  Cardiac Enzymes: No results for input(s): CKTOTAL, CKMB, CKMBINDEX, TROPONINI in the last 168 hours.  BNP (last 3 results) No results for input(s): PROBNP in the last 8760 hours.  HbA1C: No results for input(s): HGBA1C in the last 72 hours.  CBG: Recent Labs  Lab 09-02-2018 1636 02-Sep-2018 1942 08/30/18 0824  GLUCAP 99 90 130*    Lipid Profile: Recent Labs    09/02/18 1114  TRIG 122    Thyroid Function Tests: No results for input(s): TSH, T4TOTAL, FREET4, T3FREE, THYROIDAB in the last 72 hours.  Anemia Panel: Recent Labs    02-Sep-2018 1114 08/30/18 0600  FERRITIN 420* 418*    Urine analysis:    Component Value Date/Time   COLORURINE YELLOW 09/02/2018 2130   APPEARANCEUR CLEAR 2018/09/02 2130   LABSPEC 1.014 09/02/18 2130   PHURINE 7.0 02-Sep-2018 2130   GLUCOSEU NEGATIVE Sep 02, 2018 2130   HGBUR SMALL (A) 09/02/2018 2130   BILIRUBINUR NEGATIVE Sep 02, 2018 2130   KETONESUR NEGATIVE Sep 02, 2018 2130   PROTEINUR 30 (A) 2018-09-02 2130   NITRITE NEGATIVE 09/02/2018 2130   LEUKOCYTESUR NEGATIVE Sep 02, 2018 2130    Sepsis Labs: Lactic Acid, Venous    Component Value Date/Time   LATICACIDVEN 1.2 09-02-18 1057    MICROBIOLOGY: Recent Results (from the past 240 hour(s))  Blood  Culture (routine x 2)     Status: None (Preliminary result)   Collection Time: 09/02/18 10:57 AM  Result Value Ref Range Status   Specimen Description   Final    BLOOD LEFT ANTECUBITAL Performed at [facility], [address], [place], [place] [address]    Special Requests   Final    BOTTLES DRAWN AEROBIC AND ANAEROBIC Blood Culture results may not be optimal due to an excessive volume of blood received in culture bottles Performed at [facility], [address], [place], [place] [address]    Culture   Final    NO GROWTH < 24 HOURS Performed at [facility], [address] [place], [place] [address]    Report Status PENDING  Incomplete  Blood Culture (routine x 2)     Status: None (Preliminary result)   Collection Time: Sep 02, 2018 11:02 AM  Result Value Ref Range Status   Specimen Description   Final    BLOOD RIGHT ANTECUBITAL Performed at [facility], [address], [place], [place] [address]    Special Requests   Final    BOTTLES DRAWN AEROBIC AND ANAEROBIC Blood Culture results may not be optimal due to an excessive volume of blood received in culture bottles Performed at [facility], [address], [place], [place] [address]    Culture   Final    NO GROWTH < 24 HOURS Performed at [facility], [address], [place], [place] [address]    Report Status PENDING  Incomplete  SARS Coronavirus 2 (CEPHEID- Performed in [facility] hospital lab), Hosp Order     Status: Abnormal   Collection Time: 08/31/2018 11:20 AM  Result Value Ref Range Status   SARS Coronavirus 2 POSITIVE (A) NEGATIVE Final    Comment: RESULT CALLED TO, READ BACK BY AND VERIFIED WITH: K MERCHANT RN 2009 09/07/2018 A NAVARRO (NOTE) If result is NEGATIVE SARS-CoV-2 target nucleic acids are NOT DETECTED. The SARS-CoV-2 RNA is generally detectable in upper and lower  respiratory specimens during the acute phase of  infection. The lowest  concentration of SARS-CoV-2 viral copies this assay can detect is 250  copies / mL. A negative result does not preclude SARS-CoV-2 infection  and should not be used as the sole basis for treatment or other  patient management decisions.  A negative result may occur with  improper specimen collection / handling, submission of specimen other  than nasopharyngeal swab, presence of viral mutation(s) within the  areas targeted by this assay, and inadequate number of viral copies  (<250 copies / mL). A negative result must be combined with clinical  observations, patient history, and epidemiological information. If result is POSITIVE SARS-CoV-2 target nucleic acids are DETECTED.  The SARS-CoV-2 RNA is generally detectable in upper and lower  respiratory specimens during the acute phase of infection.  Positive  results are indicative of active infection with SARS-CoV-2.  Clinical  correlation with patient history and other diagnostic information is  necessary to determine patient infection status.  Positive results do  not rule out bacterial infection or co-infection with other viruses. If result is PRESUMPTIVE POSTIVE SARS-CoV-2 nucleic acids MAY BE PRESENT.   A presumptive positive result was obtained on the submitted specimen  and confirmed on repeat testing.  While 2019 novel coronavirus  (SARS-CoV-2) nucleic acids may be present in the submitted sample  additional confirmatory testing may be necessary for epidemiological  and / or clinical management purposes  to differentiate between  SARS-CoV-2 and other Sarbecovirus currently known to infect humans.  If clinically indicated additional testing with an alternate test  methodology [phone])  is advised. The SARS-CoV-2 RNA is generally  detectable in upper and lower respiratory specimens during the acute  phase of infection. The expected result is Negative. Fact Sheet for Patients:   BoilerBrush.com.cy Fact Sheet for Healthcare Providers: https://pope.com/ This test is not yet approved or cleared by the [place] FDA and has been authorized for detection and/or diagnosis of SARS-CoV-2 by FDA under an Emergency Use Authorization (EUA).  This EUA will remain in effect (meaning this test can be used) for the duration of the COVID-19 declaration under Section 564(b)(1) of the Act, 21 U.S.C. section 360bbb-3(b)(1), unless the authorization is terminated or revoked sooner. Performed at [facility], [address], [place], [place] [address]     RADIOLOGY STUDIES/RESULTS: Dg Chest Port 1 View  Result Date: 08/26/2018 CLINICAL DATA:  Body aches since Wednesday EXAM: PORTABLE CHEST  1 VIEW COMPARISON:  None. FINDINGS: Bilateral airspace opacity, likely atypical infection in this setting. There are likely calcified pulmonary nodules and hilar lymph nodes on the right. Mild cardiomegaly. No effusion or pneumothorax. IMPRESSION: Bilateral infiltrate primarily concerning for atypical infection. Electronically Signed   By: Marnee SpringJonathon  Watts M.D.   On: 08/18/2018 11:40     LOS: 1 day   Jeoffrey MassedShanker , MD  [facility]  If 7PM-7AM, please contact night-coverage  Please page via [email or web address]  Go to [email or web address] and use [facility]'s universal password to access. If you do not have the password, please contact the hospital operator.  Locate the [facility] provider you are looking for under [facility] and page to a number that you can be directly reached. If you still have difficulty reaching the provider, please page the [facility] (Director on Call) for the Hospitalists listed on amion for assistance.  08/30/2018, 11:36 AM

## 2018-08-31 ENCOUNTER — Inpatient Hospital Stay (HOSPITAL_COMMUNITY): Payer: Medicaid Other

## 2018-08-31 LAB — COMPREHENSIVE METABOLIC PANEL
ALT: 31 U/L (ref 0–44)
AST: 54 U/L — ABNORMAL HIGH (ref 15–41)
Albumin: 2.9 g/dL — ABNORMAL LOW (ref 3.5–5.0)
Alkaline Phosphatase: 45 U/L (ref 38–126)
Anion gap: 7 (ref 5–15)
BUN: 13 mg/dL (ref 8–23)
CO2: 25 mmol/L (ref 22–32)
Calcium: 7.6 mg/dL — ABNORMAL LOW (ref 8.9–10.3)
Chloride: 107 mmol/L (ref 98–111)
Creatinine, Ser: 0.86 mg/dL (ref 0.61–1.24)
GFR calc Af Amer: >60 mL/min (ref >60)
GFR calc non Af Amer: >60 mL/min (ref >60)
Glucose, Bld: 99 mg/dL (ref 70–99)
Potassium: 4 mmol/L (ref 3.5–5.1)
Sodium: 139 mmol/L (ref 135–145)
Total Bilirubin: 0.5 mg/dL (ref 0.3–1.2)
Total Protein: 6.9 g/dL (ref 6.5–8.1)

## 2018-08-31 LAB — CBC WITH DIFFERENTIAL/PLATELET
Abs Immature Granulocytes: 0.01 10*3/uL (ref 0.00–0.07)
Basophils Absolute: 0 10*3/uL (ref 0.0–0.1)
Basophils Relative: 0 %
Eosinophils Absolute: 0 10*3/uL (ref 0.0–0.5)
Eosinophils Relative: 1 %
HCT: 33.2 % — ABNORMAL LOW (ref 39.0–52.0)
Hemoglobin: 11.2 g/dL — ABNORMAL LOW (ref 13.0–17.0)
Immature Granulocytes: 0 %
Lymphocytes Relative: 34 %
Lymphs Abs: 1.2 10*3/uL (ref 0.7–4.0)
MCH: 32 pg (ref 26.0–34.0)
MCHC: 33.7 g/dL (ref 30.0–36.0)
MCV: 94.9 fL (ref 80.0–100.0)
Monocytes Absolute: 0.4 10*3/uL (ref 0.1–1.0)
Monocytes Relative: 10 %
Neutro Abs: 2 10*3/uL (ref 1.7–7.7)
Neutrophils Relative %: 55 %
Platelets: 209 10*3/uL (ref 150–400)
RBC: 3.5 MIL/uL — ABNORMAL LOW (ref 4.22–5.81)
RDW: 13.7 % (ref 11.5–15.5)
WBC: 3.6 10*3/uL — ABNORMAL LOW (ref 4.0–10.5)
nRBC: 0 % (ref 0.0–0.2)

## 2018-08-31 LAB — MAGNESIUM: Magnesium: 2.3 mg/dL (ref 1.7–2.4)

## 2018-08-31 LAB — D-DIMER, QUANTITATIVE: D-Dimer, Quant: 1.03 ug/mL-FEU — ABNORMAL HIGH (ref 0.00–0.50)

## 2018-08-31 LAB — URINE CULTURE

## 2018-08-31 LAB — GLUCOSE, CAPILLARY
Glucose-Capillary: 105 mg/dL — ABNORMAL HIGH (ref 70–99)
Glucose-Capillary: 132 mg/dL — ABNORMAL HIGH (ref 70–99)
Glucose-Capillary: 109 mg/dL — ABNORMAL HIGH (ref 70–99)
Glucose-Capillary: 125 mg/dL — ABNORMAL HIGH (ref 70–99)

## 2018-08-31 LAB — FERRITIN: Ferritin: 436 ng/mL — ABNORMAL HIGH (ref 24–336)

## 2018-08-31 LAB — TYPE AND SCREEN
ABO/RH(D): O POS
Antibody Screen: NEGATIVE

## 2018-08-31 LAB — C-REACTIVE PROTEIN: CRP: 14.5 mg/dL — ABNORMAL HIGH (ref <1.0)

## 2018-08-31 LAB — ABO/RH: ABO/RH(D): O POS

## 2018-08-31 MED ORDER — ENOXAPARIN SODIUM 40 MG/0.4ML ~~LOC~~ SOLN
40.0000 mg | Freq: Two times a day (BID) | SUBCUTANEOUS | Status: DC
  Administered 2018-08-31 – 2018-09-03 (×8): 40 mg via SUBCUTANEOUS
  Filled 2018-08-31 (×8): qty 0.4

## 2018-08-31 MED ORDER — SODIUM CHLORIDE 0.9% IV SOLUTION: Freq: Once | INTRAVENOUS | Status: DC

## 2018-08-31 MED ORDER — METHYLPREDNISOLONE SODIUM SUCC 125 MG IJ SOLR
40.0000 mg | Freq: Four times a day (QID) | INTRAMUSCULAR | Status: DC
  Administered 2018-08-31 – 2018-09-02 (×7): 40 mg via INTRAVENOUS
  Filled 2018-08-31 (×7): qty 2

## 2018-08-31 MED ORDER — FUROSEMIDE 10 MG/ML IJ SOLN
40.0000 mg | Freq: Once | INTRAMUSCULAR | Status: AC
  Administered 2018-08-31: 10:00:00 40 mg via INTRAVENOUS
  Filled 2018-08-31: qty 4

## 2018-08-31 MED ORDER — FUROSEMIDE 10 MG/ML IJ SOLN
60.0000 mg | Freq: Once | INTRAMUSCULAR | Status: AC
  Administered 2018-08-31: 60 mg via INTRAVENOUS
  Filled 2018-08-31: qty 6

## 2018-08-31 MED ORDER — METHYLPREDNISOLONE SODIUM SUCC 125 MG IJ SOLR
40.0000 mg | Freq: Once | INTRAMUSCULAR | Status: AC
  Administered 2018-08-31: 18:00:00 40 mg via INTRAVENOUS
  Filled 2018-08-31: qty 2

## 2018-08-31 NOTE — Progress Notes (Signed)
I have spent a significant amount of time since this morning talking with the patient, then with his daughter over the phone regarding convalescent plasma.  Patient speaks Spanish-and is unfortunately illiterate.  I have explained to him and his daughter (over the phone)-regarding the rationale, risks, benefits of convalescent plasma transfusion.  This afternoon, with the bedside RN present as a witness-I went over the consent form line by line with a translator, patient's daughter was also present remotely on the speaker phone.  All questions were answered-both patient and daughter gave verbal consent to enroll in the plasma convalescent study.  Since patient is a illiterate-and cannot write-after speaking with Dr. Drema Pry marked a X  against his name.  Have advised the patient's daughter to sign when possible over the next few days.  Have subsequently ordered convalescent plasma

## 2018-08-31 NOTE — Evaluation (Signed)
Physical Therapy Evaluation Patient Details Name: Craig Dorsey MRN: [id number] DOB: 12/28/1932 Today's Date: 08/31/2018   History of Present Illness  85 y.o. male who lives in [place] and is visiting family in [place] admitted 09/19/2018 with fever, body aches.  Dx with acute hypoxic respiratory failure secondary to COVID 19 PNA, HTN.    Clinical Impression  Pt was seated EOB when I entered the room, talking on the phone with his daughter.  O2 sats were 94% on 10 L O2 HFNC. He returned to sidelying and his sats dropped into the low 80s.  He reports when [facility] he gets dizzy and cannot catch his breath.  He moves easily, he just has limited tolerance of mobility from a pulmonary standpoint right now.  I positioned him in prone prior to leaving and his oxygen was worse in prone than seated EOB.  (87% in prone).  PT will continue to follow acutely for safe mobility progression    Follow Up Recommendations No PT follow up;Supervision for mobility/OOB    Equipment Recommendations  None recommended by PT;Other (comment)(may need home O2)    Recommendations for Other Services OT consult     Precautions / Restrictions Precautions Precautions: Other (comment) Precaution Comments: O2 sats Restrictions Weight Bearing Restrictions: No      Mobility  Bed Mobility Overal bed mobility: Modified Independent                Transfers Overall transfer level: Needs assistance Equipment used: None Transfers: Sit to/from Stand Sit to Stand: Supervision         General transfer comment: supervision for safety, however, with little movement he reports dizziness, lightheadedness and SOB.  He is on 10 L O2 HFNC.   Ambulation/Gait Ambulation/Gait assistance: Min guard Gait Distance (Feet): 5 Feet Assistive device: None Gait Pattern/deviations: Step-to pattern     General Gait Details: [facility] and the I helped pt position in prone as RN  reported MD wanted pt prone for most of the day.  O2 sats were better seated EOB than prone.          Balance Overall balance assessment: Needs assistance Sitting-balance support: Feet supported;No upper extremity supported Sitting balance-Leahy Scale: Good     Standing balance support: Single extremity supported Standing balance-Leahy Scale: Fair Standing balance comment: Pt held bed rail in standing.                              Pertinent Vitals/Pain Pain Assessment: No/denies pain    Home Living Family/patient expects to be discharged to:: Private residence Living Arrangements: Spouse/significant other Available Help at Discharge: Family;Available 24 hours/day             Additional Comments: Pt is visiting his daughter in [place].  He lives in [place].  His wife was sick and is now better.  They are both staying with his daughter.     Prior Function Level of Independence: Independent         Comments: pt reports he does not work because he is 86     Hand Dominance   Dominant Hand: Right    Extremity/Trunk Assessment   Upper Extremity Assessment Upper Extremity Assessment: Overall WFL for tasks assessed    Lower Extremity Assessment Lower Extremity Assessment: Overall WFL for tasks assessed    Cervical / Trunk Assessment Cervical / Trunk  Assessment: Normal  Communication   Communication: Prefers language other than English(Spanish)  Cognition Arousal/Alertness: Awake/alert Behavior During Therapy: WFL for tasks assessed/performed Overall Cognitive Status: Within Functional Limits for tasks assessed                                        General Comments General comments (skin integrity, edema, etc.): Pt with low activity tolerance.  He is on 10 L HFNC and fluctuates between low 80s and low 90s.  His best O2 sats were seated EOB (even compared to prone).          Assessment/Plan    PT Assessment Patient  needs continued PT services  PT Problem List Decreased activity tolerance;Decreased mobility;Cardiopulmonary status limiting activity;Decreased knowledge of precautions       PT Treatment Interventions DME instruction;Gait training;Functional mobility training;Therapeutic activities;Therapeutic exercise;Balance training;Stair training;Patient/family education    PT Goals (Current goals can be found in the Care Plan section)  Acute Rehab PT Goals Patient Stated Goal: to feel better again, get home. PT Goal Formulation: With patient Time For Goal Achievement: 09/14/18 Potential to Achieve Goals: Good    Frequency Min 5X/week           AM-PAC PT "6 Clicks" Mobility  Outcome Measure Help needed turning from your back to your side while in a flat bed without using bedrails?: None Help needed moving from lying on your back to sitting on the side of a flat bed without using bedrails?: None Help needed moving to and from a bed to a chair (including a wheelchair)?: A Little Help needed standing up from a chair using your arms (e.g., wheelchair or bedside chair)?: A Little Help needed to walk in hospital room?: A Little Help needed climbing 3-5 steps with a railing? : A Little 6 Click Score: 20    End of Session Equipment Utilized During Treatment: Oxygen Activity Tolerance: Other (comment)(limited by poor pulomnary tolerance. ) Patient left: in bed;Other (comment)(in prone) Nurse Communication: Mobility status;Other (comment)(pt in prone at end of session) PT Visit Diagnosis: Difficulty in walking, not elsewhere classified (R26.2)    Time: 3716-9678 PT Time Calculation (min) (ACUTE ONLY): 25 min   Charges:          Lurena Joiner B. Zarek Relph, PT, DPT  Acute Rehabilitation #[phone] pager #[phone] office   PT Evaluation $PT Eval Moderate Complexity: 1 Mod PT Treatments $Therapeutic Activity: 8-22 mins      08/31/2018, 2:19 PM

## 2018-08-31 NOTE — Progress Notes (Signed)
Plasma infusion started. Patient educated with help of interpreter. No questions at this time. Patient resting in bed. Will continue to monitor.

## 2018-08-31 NOTE — Progress Notes (Signed)
[facility] FDA EAP protocol for convalescent plasma in COVId-19 [email or web address]   2nd physician investigator consent/documentation   Per investigator Dr Lucrezia Starch -> Craig Dorsey 1933/03/15 is Spanish only speaking and illiterate.  Consent has been done per Dr Lucrezia Starch with LAR present remotely + Dr Lucrezia Starch + bedside RN as impartial witness + Certified translator. Dr Lucrezia Starch read out the entired consent in [place] and translator interpreted in [place]. Advised Dr Lucrezia Starch to get patient/subject to mark an X against his name and inability to write documented as reason for no full signature. IN addition, recommended he get LAR signature when possible. Patient currently declining and needs plasma. So, I as 2nd investigator reviewed chart and agree that this patient meets criteria for EAP protocol and gave 2nd consent for Convalescent Plasma    SIGNATURE    Dr. Kalman Shan, M.D., F.C.C.P, ACRP-CPI Pulmonary and Critical Care Medicine Research Investigator, [facility] @ [facility] Staff Physician, [facility] Director - Interstitial Lung Disease  Program  Pulmonary Fibrosis [facility] and [facility] @ [facility] [place], [place], [address]  Pager: [phone], If no answer or between  15:00h - 7:00h: call [phone] Telephone: [phone]  3:04 PM 08/31/2018

## 2018-08-31 NOTE — Progress Notes (Signed)
RN called [facility] to check on plasma. Tech states they will notify unit when dispatch is in route with it. Will administer per order once received.

## 2018-08-31 NOTE — Progress Notes (Addendum)
PROGRESS NOTE        PATIENT DETAILS Name: Craig Dorsey Age: 85 y.o. Sex: male Date of Birth: 07/03/1932 Admit Date: 09/06/2018 Admitting Physician Ankit Joline Maxcy, MD JSR:PRXYVOP, [name]  Brief Narrative: Patient is a 85 y.o. male with history of HTN, dyslipidemia-presented with myalgias, fever cough and shortness of breath-was found to have acute hypoxic respiratory failure secondary to COVID-19 pneumonia.  On admission patient was given a dose of Actemra, followed by another dose of Actemra on 5/6.  Hospital course complicated by continued worsening hypoxia requiring high flow O2.  Subjective:  Assessment/Plan: Acute Hypoxic respiratory failure secondary to COVID-19 pneumonia: Afebrile overnight-seems to have worsened and is much more short of breath with minimal activity-oxygen requirements have increased to 10-11 L via HFNC.  Chest x-ray done this morning (personally reviewed) shows worsening of bilateral infiltrates.  Have given 1 dose of Lasix this morning, Solu-Medrol started-probably will require convalescent plasma.  Have spent a significant amount of time explaining the rationale/risk/benefits of convalescent plasma to the patient and subsequently to his daughter over the phone.  This MD also went over the consent form for convalescent plasma line by line with the patient via translator-this was witnessed by bedside RN-patient and family both consent to the use of convalescent plasma-unfortunately, patient is a illiterate and is unable to sign-have reached out to Dr. Marchelle Gearing to provide further guidance before we can order convalescent plasma.  In the meantime-continue with full supportive care-encouraged the patient to lie prone for at least 16 hours a day.  The treatment plan,use of Actemra and its known side effects were discussed with patient/family, they were clearly explained that there is no proven definitive treatment for COVID-19 infection,  any medications used here are based on published clinical articles/anecdotal data which were not obtained by randomized controlled trials.  Complete risks and long-term side effects are unknown, however in the current scenario of ongoing patient deterioration,the clinical judgment is that the benefit may outweigh the risks. Patient/family agree with the treatment plan and consent to receive the recommended medications.  HTN: Blood pressure remains reasonably stable without the use of any antihypertensives-follow for now.  Dyslipidemia: Resume statins on discharge  DVT Prophylaxis: Prophylactic Lovenox-we will change to twice daily dosing  Code Status: Full code   Family Communication: None at bedside-spoke with patient's daughter over the phone  Disposition Plan: Remain inpatient  Antimicrobial agents: Anti-infectives (From admission, onward)   None      Procedures: None  CONSULTS:  None  Time spent: 55 minutes-Greater than 50% of this time was spent in counseling, explanation of diagnosis, planning of further management, and coordination of care.  The patient is critically ill with multiple organ system failure and requires high complexity decision making for assessment and support, frequent evaluation and titration of therapies, advanced monitoring, review of radiographic studies and interpretation of complex data.   MEDICATIONS: Scheduled Meds: . enoxaparin (LOVENOX) injection  40 mg Subcutaneous Q12H  . insulin aspart  0-5 Units Subcutaneous QHS  . insulin aspart  0-9 Units Subcutaneous TID WC  . methylPREDNISolone (SOLU-MEDROL) injection  40 mg Intravenous Q6H   Continuous Infusions:  PRN Meds:.acetaminophen, bisacodyl, guaiFENesin-dextromethorphan, HYDROcodone-acetaminophen, ipratropium, ondansetron **OR** ondansetron (ZOFRAN) IV, phenol, senna-docusate   PHYSICAL EXAM: Vital signs: Vitals:   08/31/18 0434 08/31/18 0748 08/31/18 1134 08/31/18 1156  BP: Marland Kitchen)  150/73 (!) 166/81 (!) 124/53 (!) 141/78  Pulse: 60 73 (!) 57 72  Resp: 20 20 (!) 25 19  Temp: 97.7 F (36.5 C) (!) 97.5 F (36.4 C) 98.4 F (36.9 C)   TempSrc: Oral Oral Oral   SpO2: 97% (!) 88% 91% 92%  Weight:      Height:       Filed Weights   09/05/2018 1600  Weight: 69.1 kg   Body mass index is 23.91 kg/m.   General appearance:Awake, alert, slightly tachypneic but easily speaking in full sentences Eyes:no scleral icterus. HEENT: Atraumatic and Normocephalic Neck: supple, no JVD. Resp:Good air entry bilaterally, bibasilar rales CVS: S1 S2 regular, no murmurs.  GI: Bowel sounds present, Non tender and not distended with no gaurding, rigidity or rebound. Extremities: B/L Lower Ext shows no edema, both legs are warm to touch Neurology:  Non focal Psychiatric: Normal judgment and insight. Normal mood. Musculoskeletal:No digital cyanosis Skin:No Rash, warm and dry Wounds:N/A  I have personally reviewed following labs and imaging studies  LABORATORY DATA: CBC: Recent Labs  Lab 09/16/2018 1114 08/30/18 0600 08/31/18 0402  WBC 8.3 7.4 3.6*  NEUTROABS 6.6 5.4 2.0  HGB 13.3 11.5* 11.2*  HCT 39.0 35.1* 33.2*  MCV 94.9 95.9 94.9  PLT 161 170 209    Basic Metabolic Panel: Recent Labs  Lab 09/03/2018 1114 08/30/18 0600 08/31/18 0402  NA 135 138 139  K 3.8 4.0 4.0  CL 103 105 107  CO2 23 23 25   GLUCOSE 128* 119* 99  BUN 9 10 13   CREATININE 0.95 1.00 0.86  CALCIUM 8.0* 7.5* 7.6*  MG  --  1.9 2.3    GFR: Estimated Creatinine Clearance: 59.6 mL/min (by C-G formula based on SCr of 0.86 mg/dL).  Liver Function Tests: Recent Labs  Lab 09/14/2018 1114 08/30/18 0600 08/31/18 0402  AST 44* 43* 54*  ALT 35 28 31  ALKPHOS 56 49 45  BILITOT 0.3 0.6 0.5  PROT 8.1 7.6 6.9  ALBUMIN 3.7 3.3* 2.9*   No results for input(s): LIPASE, AMYLASE in the last 168 hours. No results for input(s): AMMONIA in the last 168 hours.  Coagulation Profile: No results for input(s):  INR, PROTIME in the last 168 hours.  Cardiac Enzymes: No results for input(s): CKTOTAL, CKMB, CKMBINDEX, TROPONINI in the last 168 hours.  BNP (last 3 results) No results for input(s): PROBNP in the last 8760 hours.  HbA1C: No results for input(s): HGBA1C in the last 72 hours.  CBG: Recent Labs  Lab 08/30/18 1146 08/30/18 1712 08/30/18 2010 08/31/18 0746 08/31/18 1131  GLUCAP 111* 96 140* 105* 132*    Lipid Profile: Recent Labs    09/15/2018 1114  TRIG 122    Thyroid Function Tests: No results for input(s): TSH, T4TOTAL, FREET4, T3FREE, THYROIDAB in the last 72 hours.  Anemia Panel: Recent Labs    08/30/18 0600 08/31/18 0402  FERRITIN 418* 436*    Urine analysis:    Component Value Date/Time   COLORURINE YELLOW 09/05/2018 2130   APPEARANCEUR CLEAR 09/03/2018 2130   LABSPEC 1.014 09/08/2018 2130   PHURINE 7.0 08/27/2018 2130   GLUCOSEU NEGATIVE 09/01/2018 2130   HGBUR SMALL (A) 09/06/2018 2130   BILIRUBINUR NEGATIVE 08/28/2018 2130   KETONESUR NEGATIVE 09/23/2018 2130   PROTEINUR 30 (A) 09/07/2018 2130   NITRITE NEGATIVE 08/27/2018 2130   LEUKOCYTESUR NEGATIVE 09/19/2018 2130    Sepsis Labs: Lactic Acid, Venous    Component Value Date/Time   LATICACIDVEN 1.2 09/14/2018 1057  MICROBIOLOGY: Recent Results (from the past 240 hour(s))  Blood Culture (routine x 2)     Status: None (Preliminary result)   Collection Time: 08/26/2018 10:57 AM  Result Value Ref Range Status   Specimen Description   Final    BLOOD LEFT ANTECUBITAL Performed at [facility], [address], [place], [place] [address]    Special Requests   Final    BOTTLES DRAWN AEROBIC AND ANAEROBIC Blood Culture results may not be optimal due to an excessive volume of blood received in culture bottles Performed at [facility], [address] [place], [place] [address]    Culture   Final    NO GROWTH 2 DAYS Performed at [facility],  [address], [place], [place] [address]    Report Status PENDING  Incomplete  Blood Culture (routine x 2)     Status: None (Preliminary result)   Collection Time: 09/17/2018 11:02 AM  Result Value Ref Range Status   Specimen Description   Final    BLOOD RIGHT ANTECUBITAL Performed at [facility], [address], [place], [place] [address]    Special Requests   Final    BOTTLES DRAWN AEROBIC AND ANAEROBIC Blood Culture results may not be optimal due to an excessive volume of blood received in culture bottles Performed at [facility], [address], [place], [place] [address]    Culture   Final    NO GROWTH 2 DAYS Performed at [facility], [address] [place], [place] [address]    Report Status PENDING  Incomplete  SARS Coronavirus 2 (CEPHEID- Performed in [facility] hospital lab), Hosp Order     Status: Abnormal   Collection Time: 09/15/2018 11:20 AM  Result Value Ref Range Status   SARS Coronavirus 2 POSITIVE (A) NEGATIVE Final    Comment: RESULT CALLED TO, READ BACK BY AND VERIFIED WITH: K MERCHANT RN 2009 09/14/2018 A NAVARRO (NOTE) If result is NEGATIVE SARS-CoV-2 target nucleic acids are NOT DETECTED. The SARS-CoV-2 RNA is generally detectable in upper and lower  respiratory specimens during the acute phase of infection. The lowest  concentration of SARS-CoV-2 viral copies this assay can detect is 250  copies / mL. A negative result does not preclude SARS-CoV-2 infection  and should not be used as the sole basis for treatment or other  patient management decisions.  A negative result may occur with  improper specimen collection / handling, submission of specimen other  than nasopharyngeal swab, presence of viral mutation(s) within the  areas targeted by this assay, and inadequate number of viral copies  (<250 copies / mL). A negative result must be combined with clinical  observations, patient history, and epidemiological  information. If result is POSITIVE SARS-CoV-2 target nucleic acids are DETECTED.  The SARS-CoV-2 RNA is generally detectable in upper and lower  respiratory specimens during the acute phase of infection.  Positive  results are indicative of active infection with SARS-CoV-2.  Clinical  correlation with patient history and other diagnostic information is  necessary to determine patient infection status.  Positive results do  not rule out bacterial infection or co-infection with other viruses. If result is PRESUMPTIVE POSTIVE SARS-CoV-2 nucleic acids MAY BE PRESENT.   A presumptive positive result was obtained on the submitted specimen  and confirmed on repeat testing.  While 2019 novel coronavirus  (SARS-CoV-2) nucleic acids may be present in the submitted sample  additional confirmatory testing may  be necessary for epidemiological  and / or clinical management purposes  to differentiate between  SARS-CoV-2 and other Sarbecovirus currently known to infect humans.  If clinically indicated additional testing with an alternate test  methodology [phone])  is advised. The SARS-CoV-2 RNA is generally  detectable in upper and lower respiratory specimens during the acute  phase of infection. The expected result is Negative. Fact Sheet for Patients:  BoilerBrush.com.cy Fact Sheet for Healthcare Providers: https://pope.com/ This test is not yet approved or cleared by the [place] FDA and has been authorized for detection and/or diagnosis of SARS-CoV-2 by FDA under an Emergency Use Authorization (EUA).  This EUA will remain in effect (meaning this test can be used) for the duration of the COVID-19 declaration under Section 564(b)(1) of the Act, 21 U.S.C. section 360bbb-3(b)(1), unless the authorization is terminated or revoked sooner. Performed at [facility], [address] [place], [place] [address]   Urine Culture      Status: Abnormal   Collection Time: 09-25-2018  9:30 PM  Result Value Ref Range Status   Specimen Description   Final    URINE, RANDOM Performed at [facility], [address] [place], [place] [address]    Special Requests   Final    NONE Performed at [facility], [address] [place], [place] [address]    Culture MULTIPLE SPECIES PRESENT, SUGGEST RECOLLECTION (A)  Final   Report Status 08/31/2018 FINAL  Final    RADIOLOGY STUDIES/RESULTS: Dg Chest Port 1 View  Result Date: 25-Sep-2018 CLINICAL DATA:  Body aches since Wednesday EXAM: PORTABLE CHEST 1 VIEW COMPARISON:  None. FINDINGS: Bilateral airspace opacity, likely atypical infection in this setting. There are likely calcified pulmonary nodules and hilar lymph nodes on the right. Mild cardiomegaly. No effusion or pneumothorax. IMPRESSION: Bilateral infiltrate primarily concerning for atypical infection. Electronically Signed   By: Marnee Spring M.D.   On: 09-25-18 11:40   Dg Chest Port 1v Same Day  Result Date: 08/31/2018 CLINICAL DATA:  Shortness of breath.  COVID-19 EXAM: PORTABLE CHEST 1 VIEW COMPARISON:  Two days ago FINDINGS: Worsening bilateral airspace disease compatible with ARDS in this clinical setting. Cardiomegaly. No effusion or pneumothorax. IMPRESSION: ARDS with worsening airspace disease. Electronically Signed   By: Marnee Spring M.D.   On: 08/31/2018 11:05     LOS: 2 days   Jeoffrey Massed, MD  [facility]  If 7PM-7AM, please contact night-coverage  Please page via [email or web address]  Go to [email or web address] and use [facility]'s universal password to access. If you do not have the password, please contact the hospital operator.  Locate the [facility] provider you are looking for under [facility] and page to a number that you can be directly reached. If you still have difficulty reaching the provider, please page the [facility] (Director on Call) for the Hospitalists listed on  amion for assistance.  08/31/2018, 2:34 PM

## 2018-08-31 NOTE — Research (Signed)
I, Jeoffrey Massed, MD, consented Subject Craig Dorsey (male, Date of Birth 19-May-1932, 85 y.o.) and with diagnosis of COVID-19, in the [facility] Expanded Access Program (EAP) Research Protocol for [employer] against COVID-19.  The consent took place under following circumstances.    Subject Capacity assessed by this investigator as:  Presence of adequate emotional and mental capacity to consent with inability to read (illiterate, impaired vision, glasses unavailable).  Consent took place in the following setting(s):  In-room, face to face   The following were present for the consent process:  Investigator Impartial Witness, identified as Thelma Comp RN, contact number [phone]   A copy of the cover letter and signed consent document was provided to subject/LAR.  The original signed consent document has been placed in the subject's physical chart and will be scanned into the electronic medical record upon discharge.  Statement of acknowledgement that the following was discussed with the subject/LAR:    1) Discussed the purpose of the research and procedures  2) Discussed risks and benefits and uncertainties of study participation 3) Discussed subject's responsibilities  4) Discussed the measures in place to maintain subject's confidentiality while a participant on the trial  5) Discussed alternatives to study participation.   6) Discussed study participation is voluntary and that the subject's care would not be jeopardized if they declined participation in the study.   7) Discussed freedom to withdraw at any time.   8) All subject/LAR questions were answered to their satisfaction.   9) In case of emergency consent, investigator agreed to discuss with subject/LAR at earliest available opportunity when the subject stabilizes and/or LAR can be located.     Final Investigator [address] [place], [place] [id number]   Date: 08/31/2018 and 3:30 PM

## 2018-08-31 NOTE — Progress Notes (Signed)
The importance of remaining prone has been discussed with Craig Dorsey several times by nursing and the MD. Patient continues to only remain prone for a few minutes and then sit edge of bed despite efforts to make him comfortable in the prone position.

## 2018-08-31 NOTE — Progress Notes (Signed)
MD reviewed consent for convalescent plasma with patient. His daughter, Milus Rahlf was present for the conversation via telephone. A interpretor translated for the patient and his daughter while MD reviewed consent in its entirety, line by line, with this RN at the bedside. All questions were answered. Both patient and his daughter verbalized understanding of risk and benefit and consented for treatment. The patient is unable to sign name, however this RN witnessed verbal consent and signature of an "x" by Craig Dorsey on the consent form.

## 2018-08-31 NOTE — Care Management (Signed)
Hospital f/u televisit appointment has been scheduled with: [facility] on 09/12/18 @ 0930; AVS updated.   Colleen Can RN, BSN, NCM-BC, ACM-RN [phone]

## 2018-09-01 LAB — COMPREHENSIVE METABOLIC PANEL
ALT: 47 U/L — ABNORMAL HIGH (ref 0–44)
AST: 68 U/L — ABNORMAL HIGH (ref 15–41)
Albumin: 3.4 g/dL — ABNORMAL LOW (ref 3.5–5.0)
Alkaline Phosphatase: 54 U/L (ref 38–126)
Anion gap: 12 (ref 5–15)
BUN: 20 mg/dL (ref 8–23)
CO2: 25 mmol/L (ref 22–32)
Calcium: 8 mg/dL — ABNORMAL LOW (ref 8.9–10.3)
Chloride: 102 mmol/L (ref 98–111)
Creatinine, Ser: 0.95 mg/dL (ref 0.61–1.24)
GFR calc Af Amer: >60 mL/min (ref >60)
GFR calc non Af Amer: >60 mL/min (ref >60)
Glucose, Bld: 151 mg/dL — ABNORMAL HIGH (ref 70–99)
Potassium: 3.5 mmol/L (ref 3.5–5.1)
Sodium: 139 mmol/L (ref 135–145)
Total Bilirubin: 0.7 mg/dL (ref 0.3–1.2)
Total Protein: 7.7 g/dL (ref 6.5–8.1)

## 2018-09-01 LAB — CBC WITH DIFFERENTIAL/PLATELET
Abs Immature Granulocytes: 0.04 10*3/uL (ref 0.00–0.07)
Basophils Absolute: 0 10*3/uL (ref 0.0–0.1)
Basophils Relative: 0 %
Eosinophils Absolute: 0 10*3/uL (ref 0.0–0.5)
Eosinophils Relative: 0 %
HCT: 34.9 % — ABNORMAL LOW (ref 39.0–52.0)
Hemoglobin: 11.8 g/dL — ABNORMAL LOW (ref 13.0–17.0)
Immature Granulocytes: 1 %
Lymphocytes Relative: 9 %
Lymphs Abs: 0.4 10*3/uL — ABNORMAL LOW (ref 0.7–4.0)
MCH: 31.5 pg (ref 26.0–34.0)
MCHC: 33.8 g/dL (ref 30.0–36.0)
MCV: 93.1 fL (ref 80.0–100.0)
Monocytes Absolute: 0.1 10*3/uL (ref 0.1–1.0)
Monocytes Relative: 2 %
Neutro Abs: 3.7 10*3/uL (ref 1.7–7.7)
Neutrophils Relative %: 88 %
Platelets: 279 10*3/uL (ref 150–400)
RBC: 3.75 MIL/uL — ABNORMAL LOW (ref 4.22–5.81)
RDW: 13.2 % (ref 11.5–15.5)
WBC: 4.2 10*3/uL (ref 4.0–10.5)
nRBC: 0 % (ref 0.0–0.2)

## 2018-09-01 LAB — BPAM FFP
Blood Product Expiration Date: 202005082005
ISSUE DATE / TIME: 202005072044
Unit Type and Rh: 5100

## 2018-09-01 LAB — FERRITIN: Ferritin: 588 ng/mL — ABNORMAL HIGH (ref 24–336)

## 2018-09-01 LAB — GLUCOSE, CAPILLARY
Glucose-Capillary: 163 mg/dL — ABNORMAL HIGH (ref 70–99)
Glucose-Capillary: 189 mg/dL — ABNORMAL HIGH (ref 70–99)

## 2018-09-01 LAB — PREPARE FRESH FROZEN PLASMA

## 2018-09-01 LAB — D-DIMER, QUANTITATIVE: D-Dimer, Quant: 1.1 ug/mL-FEU — ABNORMAL HIGH (ref 0.00–0.50)

## 2018-09-01 LAB — C-REACTIVE PROTEIN: CRP: 5.5 mg/dL — ABNORMAL HIGH (ref <1.0)

## 2018-09-01 LAB — MAGNESIUM: Magnesium: 2 mg/dL (ref 1.7–2.4)

## 2018-09-01 MED ORDER — VITAMIN C 500 MG PO TABS
500.0000 mg | ORAL_TABLET | Freq: Every day | ORAL | Status: DC
  Administered 2018-09-01 – 2018-09-03 (×3): 500 mg via ORAL
  Filled 2018-09-01 (×3): qty 1

## 2018-09-01 MED ORDER — POTASSIUM CHLORIDE CRYS ER 20 MEQ PO TBCR
40.0000 meq | EXTENDED_RELEASE_TABLET | Freq: Two times a day (BID) | ORAL | Status: DC
  Administered 2018-09-01 (×2): 40 meq via ORAL
  Filled 2018-09-01 (×2): qty 2

## 2018-09-01 MED ORDER — FAMOTIDINE 20 MG PO TABS
20.0000 mg | ORAL_TABLET | Freq: Every day | ORAL | Status: DC
  Administered 2018-09-01 – 2018-09-03 (×3): 20 mg via ORAL
  Filled 2018-09-01 (×3): qty 1

## 2018-09-01 MED ORDER — ZINC SULFATE 220 (50 ZN) MG PO CAPS
220.0000 mg | ORAL_CAPSULE | Freq: Every day | ORAL | Status: DC
  Administered 2018-09-01 – 2018-09-03 (×3): 220 mg via ORAL
  Filled 2018-09-01 (×3): qty 1

## 2018-09-01 MED ORDER — FUROSEMIDE 10 MG/ML IJ SOLN
40.0000 mg | Freq: Once | INTRAMUSCULAR | Status: AC
  Administered 2018-09-01: 12:00:00 40 mg via INTRAVENOUS
  Filled 2018-09-01: qty 4

## 2018-09-01 NOTE — Progress Notes (Deleted)
Dr Clearnce Sorrel Notified. No new orders at this time.

## 2018-09-01 NOTE — Progress Notes (Signed)
Notified by nurse @1945  she assisted NT get pt in bed. Pt was less responsive, very weak as pt had previously been able to stand at [facility] & reposition self in bed. 1800 pt was c/o dizziness. Pt with increased RR 36-44 when laying supine, elevated HOB to 25 degrees & breathing easier RR 20. Hands & feet remain cold, no other complaints. Notified hospitalist S. Purhoit and he states continue to monitor pt, no orders

## 2018-09-01 NOTE — Progress Notes (Addendum)
PROGRESS NOTE  Craig MonasSilvestre Vila WJX:[id number] DOB: 09/05/1932 DOA: 08/26/2018  PCP: Patient, [name]  Brief History/Interval Summary: Patient is a 85 y.o. male with history of HTN, dyslipidemia-presented with myalgias, fever cough and shortness of breath-was found to have acute hypoxic respiratory failure secondary to COVID-19 pneumonia.  On admission patient was given a dose of Actemra, followed by another dose of Actemra on 5/6.  Hospital course complicated by continued worsening hypoxia requiring high flow O2.  Reason for Visit: Acute respiratory failure with hypoxia secondary to COVID-19  Consultants: None  Procedures: None  Antibiotics: None  Subjective/Interval History: Interpreter services were utilized.  Patient states that he is feeling better.  Shortness of breath has improved.  Cough is better.  Denies any expectoration with coughing.  No nausea or vomiting.  No chest pain.   Assessment/Plan:  Acute Hypoxic Resp. Failure due to Acute Covid 19 Viral Illness during the ongoing 2020 Covid 19 Pandemic  COVID-19 Labs  Recent Labs    09/21/2018 1114 08/30/18 0600 08/31/18 0402 09/01/18 0445  DDIMER 0.70* 0.90* 1.03* 1.10*  FERRITIN 420* 418* 436* 588*  LDH 223*  --   --   --   CRP 16.0* 19.1* 14.5* 5.5*    Lab Results  Component Value Date   SARSCOV2NAA POSITIVE (A) 09/24/2018     Last episode of fever: 100.5 on 5/6.  T-max was 101.9 on 5/5. Oxygen requirements: On 10 L of oxygen by high flow nasal cannula.  Being titrated down. Antibiotics: None given during this hospital stay Steroids: Patient was placed on Solu-Medrol Actemra: Patient was given 2 doses (5/5 and 5/6) Convalescent Plasma: This was given on 5/7 after research protocol was followed by one of the investigators, Dr. Jerral RalphGhimire. Vitramin C and Zinc: Will be ordered today Diuretics: Patient given furosemide on 5/7.  Another dose will be given today.  Prone positioning: Patient encouraged to stay in  prone position as much as possible.  Inflammatory markers and d-dimer as above.    Patient's respiratory status appears to have improved slightly after he was given the convalescent plasma yesterday.  His CRP has improved to 5.5.  D-dimer 1.1.  Ferritin 588.  Hopefully he will continue to improve.  Continue to wean down oxygen.  Mobilize.  PT and OT is following.  Mildly elevated left is noted.  Continue to monitor daily.   The treatment plan and use of medications and known side effects were discussed with patient/family. It was clearly explained that there is no proven definitive treatment for COVID-19 infection yet. Any medications used here are based on case reports/anecdotal data which are not peer-reviewed and has not been studied using randomized control trials.  Complete risks and long-term side effects are unknown, however in the best clinical judgment they seem to be of some clinical benefit rather than medical risks.  Patient/family agree with the treatment plan and want to receive these treatments as indicated.  Patient was given 2 doses of Actemra. And then enrolled into the research study for convalescent plasma, which was given on 5/7.  Essential hypertension Blood pressure noted to be elevated at times.  Elevated blood pressure could be due to steroids.  Home medication list does not list any home medications.  Patient may need to be on antihypertensives.  History of dyslipidemia Apparently on statins but again no medication listed on his home medication list.  Normocytic anemia No evidence of overt bleeding.  Continue to monitor.   DVT Prophylaxis:  Lovenox  Lab Results  Component Value Date   PLT 279 09/01/2018     PUD Prophylaxis: Initiate Pepcid Code Status: Full code Family Communication: Will discuss with his daughter later today.  Discussed in detail with patient Disposition Plan: Management as outlined above.  Mobilize.  Wean down oxygen as much as  tolerated.   Medications:  Scheduled: . sodium chloride   Intravenous Once  . enoxaparin (LOVENOX) injection  40 mg Subcutaneous Q12H  . insulin aspart  0-5 Units Subcutaneous QHS  . insulin aspart  0-9 Units Subcutaneous TID WC  . methylPREDNISolone (SOLU-MEDROL) injection  40 mg Intravenous Q6H   Continuous:  ZOX:[id number], bisacodyl, guaiFENesin-dextromethorphan, HYDROcodone-acetaminophen, ipratropium, ondansetron **OR** ondansetron (ZOFRAN) IV, phenol, senna-docusate   Objective:  Vital Signs  Vitals:   08/31/18 2333 09/01/18 0014 09/01/18 0342 09/01/18 1100  BP: (!) 194/94 (!) 153/74 (!) 168/86   Pulse: 84 75 75   Resp: (!) 56 (!) 30 (!) 25   Temp: 98.6 F (37 C)  98.1 F (36.7 C)   TempSrc: Oral  Oral   SpO2: (!) 85% 91% 90% (!) 89%  Weight:      Height:        Intake/Output Summary (Last 24 hours) at 09/01/2018 1110 Last data filed at 09/01/2018 0500 Gross per 24 hour  Intake 580 ml  Output 850 ml  Net -270 ml   Filed Weights   09/10/2018 1600  Weight: 69.1 kg    General appearance: alert, cooperative, appears stated age and no distress Head: Normocephalic, without obvious abnormality, atraumatic Resp: Coarse breath sounds bilaterally.  Mildly tachypneic at rest.  Crackles appreciated in the right base.  No wheezing or rhonchi today. Cardio: regular rate and rhythm, S1, S2 normal, no murmur, click, rub or gallop GI: soft, non-tender; bowel sounds normal; no masses,  no organomegaly Extremities: extremities normal, atraumatic, no cyanosis or edema Pulses: 2+ and symmetric Skin: Skin color, texture, turgor normal. No rashes or lesions Neurologic: No focal neurological deficits.  Lab Results:  Data Reviewed: I have personally reviewed following labs and imaging studies  CBC: Recent Labs  Lab 09-10-2018 1114 08/30/18 0600 08/31/18 0402 09/01/18 0445  WBC 8.3 7.4 3.6* 4.2  NEUTROABS 6.6 5.4 2.0 3.7  HGB 13.3 11.5* 11.2* 11.8*  HCT 39.0 35.1* 33.2*  34.9*  MCV 94.9 95.9 94.9 93.1  PLT 161 170 209 279    Basic Metabolic Panel: Recent Labs  Lab Sep 10, 2018 1114 08/30/18 0600 08/31/18 0402 09/01/18 0445  NA 135 138 139 139  K 3.8 4.0 4.0 3.5  CL 103 105 107 102  CO2 GLUCOSE 128* 119* 99 151*  BUN CREATININE 0.95 1.00 0.86 0.95  CALCIUM 8.0* 7.5* 7.6* 8.0*  MG  --  1.9 2.3 2.0    GFR: Estimated Creatinine Clearance: 54 mL/min (by C-G formula based on SCr of 0.95 mg/dL).  Liver Function Tests: Recent Labs  Lab 09/10/18 1114 08/30/18 0600 08/31/18 0402 09/01/18 0445  AST 44* 43* 54* 68*  ALT 35 28 31 47*  ALKPHOS 56 49 45 54  BILITOT 0.3 0.6 0.5 0.7  PROT 8.1 7.6 6.9 7.7  ALBUMIN 3.7 3.3* 2.9* 3.4*    CBG: Recent Labs  Lab 08/31/18 0746 08/31/18 1131 08/31/18 1653 08/31/18 1956 09/01/18 0810  GLUCAP 105* 132* 109* 125* 163*    Lipid Profile: Recent Labs    09-10-2018 1114  TRIG 122    Anemia Panel: Recent Labs  08/31/18 0402 09/01/18 0445  FERRITIN 436* 588*    Recent Results (from the past 240 hour(s))  Blood Culture (routine x 2)     Status: None (Preliminary result)   Collection Time: Sep 14, 2018 10:57 AM  Result Value Ref Range Status   Specimen Description   Final    BLOOD LEFT ANTECUBITAL Performed at [facility], [address], [place], [place] [address]    Special Requests   Final    BOTTLES DRAWN AEROBIC AND ANAEROBIC Blood Culture results may not be optimal due to an excessive volume of blood received in culture bottles Performed at [facility], [address] [place], [place] [address]    Culture   Final    NO GROWTH 3 DAYS Performed at [facility], [address], [place], [place] [address]    Report Status PENDING  Incomplete  Blood Culture (routine x 2)     Status: None (Preliminary result)   Collection Time: 09/14/18 11:02 AM  Result Value Ref Range Status   Specimen Description   Final    BLOOD  RIGHT ANTECUBITAL Performed at [facility], [address] [place], [place] [address]    Special Requests   Final    BOTTLES DRAWN AEROBIC AND ANAEROBIC Blood Culture results may not be optimal due to an excessive volume of blood received in culture bottles Performed at [facility], [address], [place], [place] [address]    Culture   Final    NO GROWTH 3 DAYS Performed at [facility], [address], [place], [place] [address]    Report Status PENDING  Incomplete  SARS Coronavirus 2 (CEPHEID- Performed in [facility] hospital lab), Hosp Order     Status: Abnormal   Collection Time: 09/14/18 11:20 AM  Result Value Ref Range Status   SARS Coronavirus 2 POSITIVE (A) NEGATIVE Final    Comment: RESULT CALLED TO, READ BACK BY AND VERIFIED WITH: K MERCHANT RN 2009 09/14/18 A NAVARRO (NOTE) If result is NEGATIVE SARS-CoV-2 target nucleic acids are NOT DETECTED. The SARS-CoV-2 RNA is generally detectable in upper and lower  respiratory specimens during the acute phase of infection. The lowest  concentration of SARS-CoV-2 viral copies this assay can detect is 250  copies / mL. A negative result does not preclude SARS-CoV-2 infection  and should not be used as the sole basis for treatment or other  patient management decisions.  A negative result may occur with  improper specimen collection / handling, submission of specimen other  than nasopharyngeal swab, presence of viral mutation(s) within the  areas targeted by this assay, and inadequate number of viral copies  (<250 copies / mL). A negative result must be combined with clinical  observations, patient history, and epidemiological information. If result is POSITIVE SARS-CoV-2 target nucleic acids are DETECTED.  The SARS-CoV-2 RNA is generally detectable in upper and lower  respiratory specimens during the acute phase of infection.  Positive  results are indicative of active infection  with SARS-CoV-2.  Clinical  correlation with patient history and other diagnostic information is  necessary to determine patient infection status.  Positive results do  not rule out bacterial infection or co-infection with other viruses. If result is PRESUMPTIVE POSTIVE SARS-CoV-2 nucleic acids MAY BE PRESENT.   A presumptive positive result was obtained on the submitted specimen  and confirmed on repeat testing.  While 2019 novel coronavirus  (SARS-CoV-2) nucleic acids may be  present in the submitted sample  additional confirmatory testing may be necessary for epidemiological  and / or clinical management purposes  to differentiate between  SARS-CoV-2 and other Sarbecovirus currently known to infect humans.  If clinically indicated additional testing with an alternate test  methodology [phone])  is advised. The SARS-CoV-2 RNA is generally  detectable in upper and lower respiratory specimens during the acute  phase of infection. The expected result is Negative. Fact Sheet for Patients:  BoilerBrush.com.cy Fact Sheet for Healthcare Providers: https://pope.com/ This test is not yet approved or cleared by the [place] FDA and has been authorized for detection and/or diagnosis of SARS-CoV-2 by FDA under an Emergency Use Authorization (EUA).  This EUA will remain in effect (meaning this test can be used) for the duration of the COVID-19 declaration under Section 564(b)(1) of the Act, 21 U.S.C. section 360bbb-3(b)(1), unless the authorization is terminated or revoked sooner. Performed at [facility], [address] [place], [place] [address]   Urine Culture     Status: Abnormal   Collection Time: 09-12-18  9:30 PM  Result Value Ref Range Status   Specimen Description   Final    URINE, RANDOM Performed at [facility], [address] [place], [place] [address]    Special Requests   Final     NONE Performed at [facility], [address], [place], [place] [address]    Culture MULTIPLE SPECIES PRESENT, SUGGEST RECOLLECTION (A)  Final   Report Status 08/31/2018 FINAL  Final      Radiology Studies: Dg Chest Port 1v Same Day  Result Date: 08/31/2018 CLINICAL DATA:  Shortness of breath.  COVID-19 EXAM: PORTABLE CHEST 1 VIEW COMPARISON:  Two days ago FINDINGS: Worsening bilateral airspace disease compatible with ARDS in this clinical setting. Cardiomegaly. No effusion or pneumothorax. IMPRESSION: ARDS with worsening airspace disease. Electronically Signed   By: Marnee Spring M.D.   On: 08/31/2018 11:05       LOS: 3 days   Johaan Ryser Rito Ehrlich  [facility] Pager on [email or web address]  09/01/2018, 11:10 AM

## 2018-09-01 NOTE — Progress Notes (Signed)
Occupational Therapy Evaluation Patient Details Name: Craig Dorsey MRN: [id number] DOB: May 30, 1932 Today's Date: 09/01/2018    History of Present Illness 85 y.o. male who lives in [place] and is visiting family in the [place] admitted 09/02/2018 with fever, body aches.  Dx with acute hypoxic respiratory failure secondary to COVID 19 PNA, HTN.     Clinical Impression   Interpreter [phone] [facility]) used throughout session. PTA, pt lived in [place] with his wife and was independent with ADL and mobility. Pt in [place] visiting with his wife visiting his daughter. SpO2 sats ranged from 77-91 on 10L HFNC (poor pleth). RR 28; BP 153/73; HR 94. Pt complains of increased SOB with increased mobility, however he states he feels better today than yesterday. States he feels better sitting up in chair.Will follow acutely to facilitate safe DC home with family. Do not anticipate need for follow up OT. Pt very appreciative.     Follow Up Recommendations  No OT follow up;Supervision - Intermittent    Equipment Recommendations  None recommended by OT    Recommendations for Other Services       Precautions / Restrictions Precautions Precautions: Other (comment) Precaution Comments: O2 sats      Mobility Bed Mobility Overal bed mobility: Modified Independent                Transfers Overall transfer level: Needs assistance Equipment used: None Transfers: Sit to/from Stand Sit to Stand: Supervision         General transfer comment: supervision for safety, however, with little movement he reports dizziness, lightheadedness and SOB.  He is on 10 L O2 HFNC.     Balance Overall balance assessment: Needs assistance Sitting-balance support: Feet supported;No upper extremity supported Sitting balance-Leahy Scale: Good     Standing balance support: Single extremity supported Standing balance-Leahy Scale: Fair Standing balance comment: Pt held bed rail in standing.                             ADL either performed or assessed with clinical judgement   ADL Overall ADL's : Needs assistance/impaired                                     Functional mobility during ADLs: Min guard(HHA) General ADL Comments: Able to complete ADL wtih set up and minguard A for LB ADL due to feeling dizzy when he stands at timews; Pt limited by poor activity tolerance at this tie.      Vision Baseline Vision/History: Wears glasses Wears Glasses: At all times Patient Visual Report: Other (comment)(does not have them)       Perception     Praxis      Pertinent Vitals/Pain Pain Assessment: Faces Faces Pain Scale: Hurts a little bit Pain Location: chest discomfort with breathing Pain Descriptors / Indicators: Discomfort Pain Intervention(s): Limited activity within patient's tolerance     Hand Dominance Right   Extremity/Trunk Assessment Upper Extremity Assessment Upper Extremity Assessment: Overall WFL for tasks assessed   Lower Extremity Assessment Lower Extremity Assessment: Defer to PT evaluation   Cervical / Trunk Assessment Cervical / Trunk Assessment: Normal   Communication Communication Communication: Prefers language other than English(Spanish)   Cognition Arousal/Alertness: Awake/alert Behavior During Therapy: WFL for tasks assessed/performed Overall Cognitive Status: Within Functional Limits for tasks assessed  General Comments       Exercises Exercises: Other exercises Other Exercises Other Exercises: incentive spirometer x 10 Other Exercises: BUE AROM x 25  Other Exercises: marching in chair x 25   Shoulder Instructions      Home Living Family/patient expects to be discharged to:: Private residence Living Arrangements: Spouse/significant other Available Help at Discharge: Family;Available 24 hours/day Type of Home: House       Home Layout: One level     Bathroom  Shower/Tub: Producer, television/film/videoWalk-in shower   Bathroom Toilet: Standard Bathroom Accessibility: No   Home Equipment: None   Additional Comments: Pt is visiting his daughter in the [place].  He lives in [place].  His wife was sick and is now better.  They are both staying with his daughter.       Prior Functioning/Environment Level of Independence: Independent        Comments: pt reports he does not work because he is [age]        OT Problem List: Decreased activity tolerance;Cardiopulmonary status limiting activity      OT Treatment/Interventions: Self-care/ADL training;Therapeutic exercise;Energy conservation;DME and/or AE instruction;Therapeutic activities;Patient/family education    OT Goals(Current goals can be found in the care plan section) Acute Rehab OT Goals Patient Stated Goal: to feel better again, get home. OT Goal Formulation: With patient Time For Goal Achievement: 09/15/18 Potential to Achieve Goals: Good  OT Frequency: Min 3X/week   Barriers to D/C:            Co-evaluation              AM-PAC OT "6 Clicks" Daily Activity     Outcome Measure Help from another person eating meals?: None Help from another person taking care of personal grooming?: A Little Help from another person toileting, which includes using toliet, bedpan, or urinal?: A Little Help from another person bathing (including washing, rinsing, drying)?: A Little Help from another person to put on and taking off regular upper body clothing?: None Help from another person to put on and taking off regular lower body clothing?: A Little 6 Click Score: 20   End of Session Equipment Utilized During Treatment: Oxygen(10L) Nurse Communication: Mobility status;Other (comment)(O2 Sats)  Activity Tolerance: Patient tolerated treatment well Patient left: in chair;with call bell/phone within reach  OT Visit Diagnosis: Unsteadiness on feet (R26.81);Other (comment)(poor activity toleracne)                 Time: [id number] OT Time Calculation (min): 32 min Charges:  OT General Charges $OT Visit: 1 Visit OT Evaluation $OT Eval Moderate Complexity: 1 Mod OT Treatments $Self Care/Home Management : 8-22 mins  Luisa DagoHilary Sumedh Shinsato, OT/L   Acute OT Clinical Specialist Acute Rehabilitation Services Pager [phone] Office [phone]   [facility] RougeWARD,HILLARY 09/01/2018, 9:39 AM

## 2018-09-01 NOTE — Progress Notes (Signed)
Attempted to call daughter, no answer.

## 2018-09-01 NOTE — Progress Notes (Signed)
PT pm treatment  09/01/18 1400  PT Visit Information  Last PT Received On 09/01/18     Pt motivated to amb however fatigued rapidly with amb this pm; SpO2= 90s with forehead probe in place for reading sats, on 2-3 L [place]; RR in 20s although 3-4/4DOE; continue to follow   Assistance Needed +1  History of Present Illness 85 y.o. male who lives in [place] and is visiting family in the [place] admitted 09-24-18 with fever, body aches.  Dx with acute hypoxic respiratory failure secondary to COVID 19 PNA, HTN.    Subjective Data  Patient Stated Goal to feel better again, get home.  Precautions  Precaution Comments monitor sats  Pain Assessment  Pain Assessment No/denies pain  Cognition  Arousal/Alertness Awake/alert  Behavior During Therapy WFL for tasks assessed/performed  Overall Cognitive Status Within Functional Limits for tasks assessed  General Comments interpreter used [phone]  Bed Mobility  Overal bed mobility Modified Independent  Transfers  Overall transfer level Needs assistance  Equipment used None  Transfers Sit to/from Stand  Sit to Stand Supervision  General transfer comment close supervision for safety, incr time, incr WOB with standing  Ambulation/Gait  Ambulation/Gait assistance Min guard;Min assist  Gait Distance (Feet) 25 Feet  Assistive device Rolling walker (2 wheeled);1 person hand held assist;None  Gait Pattern/deviations Step-through pattern;Decreased stride length  General Gait Details unsteady gait, mildly impulsive and dizzy after amb, BP stable; sats in 90s now on 2-3L [place] with forehead probe  Gait velocity decr   Balance  Sitting-balance support Feet supported;No upper extremity supported  Sitting balance-Leahy Scale Good  Standing balance-Leahy Scale Fair  Standing balance comment HHA/unilateral UE support for dynamic  Other Exercises  Other Exercises IS encouraged, pt is able to return demo  PT - End of Session  Equipment Utilized During Treatment  Oxygen;Gait belt  Activity Tolerance Other (comment);Patient limited by fatigue (cardiopulmonary status limiting)  Patient left with call bell/phone within reach;in bed  Nurse Communication Mobility status;Other (comment) (forehead probe for O2 )   PT - Assessment/Plan  PT Plan Current plan remains appropriate  PT Visit Diagnosis Difficulty in walking, not elsewhere classified (R26.2)  PT Frequency (ACUTE ONLY) Min 5X/week  Follow Up Recommendations No PT follow up;Supervision for mobility/OOB  PT equipment Other (comment) (likely will  need home O2)  AM-PAC PT "6 Clicks" Mobility Outcome Measure (Version 2)  Help needed turning from your back to your side while in a flat bed without using bedrails? 4  Help needed moving from lying on your back to sitting on the side of a flat bed without using bedrails? 4  Help needed moving to and from a bed to a chair (including a wheelchair)? 3  Help needed standing up from a chair using your arms (e.g., wheelchair or bedside chair)? 3  Help needed to walk in hospital room? 3  Help needed climbing 3-5 steps with a railing?  3  6 Click Score 20  Consider Recommendation of Discharge To: Home with no services  Acute Rehab PT Goals  PT Goal Formulation With patient  Time For Goal Achievement 09/14/18  Potential to Achieve Goals Good  PT Time Calculation  PT Start Time (ACUTE ONLY) 1359  PT Stop Time (ACUTE ONLY) 1416  PT Time Calculation (min) (ACUTE ONLY) 17 min  PT General Charges  $$ ACUTE PT VISIT 1 Visit  PT Treatments  $Gait Training 8-22 mins

## 2018-09-01 NOTE — Progress Notes (Signed)
Physical Therapy Treatment Patient Details Name: Craig Dorsey Sky MRN: [id number] DOB: 09/03/1932 Today's Date: 09/01/2018    History of Present Illness 85 y.o. male who lives in [place] and is visiting family in the [place] admitted 09/07/2018 with fever, body aches.  Dx with acute hypoxic respiratory failure secondary to COVID 19 PNA, HTN.      PT Comments    Activity tolerance improving however pt continues to fatigue quickly requiring seated recovery after amb 22'; earlobe sat monitor reading in low 80s, switched to forehead probe and SpO2 = 100% on 8-10L HFNC.  Reviewed importance of correct posture with adequate inhalation/exhalation time. Discussed normalcy of rapid fatigue at this time and pt verbalizes understanding. he is motivated to work within his tolerance. Continue PT POC  Follow Up Recommendations  No PT follow up;Supervision for mobility/OOB     Equipment Recommendations  Other (comment)(likely will  need home O2)    Recommendations for Other Services       Precautions / Restrictions Precautions Precautions: Other (comment) Precaution Comments: monitor sats    Mobility  Bed Mobility Overal bed mobility: Modified Independent             General bed mobility comments: in chair on PT arrival  Transfers Overall transfer level: Needs assistance Equipment used: None Transfers: Sit to/from Stand Sit to Stand: Supervision         General transfer comment: close supervision for safety, incr time, incr WOB with standing  Ambulation/Gait Ambulation/Gait assistance: Min guard;Min assist Gait Distance (Feet): 22 Feet(x2 in room only d/t HFNC) Assistive device: 1 person hand held assist;2 person hand held assist;None Gait Pattern/deviations: Step-through pattern;Decreased stride length Gait velocity: decr    General Gait Details: cues for trunk extension, breathing and to self monitor SOB/fatigue; pt with 3-4/4 DOE and required seated rest after initial  distance, 4 minute recovery; earlobe sat monitor reading at 80%, switched to forehead probe and SpO2= 100% on 8-10L HFNC   Stairs             Wheelchair Mobility    Modified Rankin (Stroke Patients Only)       Balance Overall balance assessment: Needs assistance Sitting-balance support: Feet supported;No upper extremity supported Sitting balance-Leahy Scale: Good     Standing balance support: Single extremity supported Standing balance-Leahy Scale: Fair Standing balance comment: HHA/unilateral UE support for dynamic                            Cognition Arousal/Alertness: Awake/alert Behavior During Therapy: WFL for tasks assessed/performed Overall Cognitive Status: Within Functional Limits for tasks assessed                                 General Comments: interpreter used [phone]      Exercises Other Exercises Other Exercises: IS encouraged, pt is able to return demo Other Exercises: BUE AROM x 25  Other Exercises: marching in chair x 25    General Comments        Pertinent Vitals/Pain Pain Assessment: Faces Faces Pain Scale: Hurts a little bit Pain Location: chest discomfort with breathing Pain Descriptors / Indicators: Discomfort Pain Intervention(s): Monitored during session;Limited activity within patient's tolerance    Home Living Family/patient expects to be discharged to:: Private residence Living Arrangements: Spouse/significant other Available Help at Discharge: Family;Available 24 hours/day Type of Home: House     Home Layout:  One level Home Equipment: None Additional Comments: Pt is visiting his daughter in the [place].  He lives in [place].  His wife was sick and is now better.  They are both staying with his daughter.     Prior Function Level of Independence: Independent      Comments: pt reports he does not work because he is 67   PT Goals (current goals can now be found in the care plan section)  Acute Rehab PT Goals Patient Stated Goal: to feel better again, get home. PT Goal Formulation: With patient Time For Goal Achievement: 09/14/18 Potential to Achieve Goals: Good Progress towards PT goals: Progressing toward goals    Frequency    Min 5X/week      PT Plan Current plan remains appropriate    Co-evaluation              AM-PAC PT "6 Clicks" Mobility   Outcome Measure  Help needed turning from your back to your side while in a flat bed without using bedrails?: None Help needed moving from lying on your back to sitting on the side of a flat bed without using bedrails?: None Help needed moving to and from a bed to a chair (including a wheelchair)?: A Little Help needed standing up from a chair using your arms (e.g., wheelchair or bedside chair)?: A Little Help needed to walk in hospital room?: A Little Help needed climbing 3-5 steps with a railing? : A Little 6 Click Score: 20    End of Session Equipment Utilized During Treatment: Oxygen Activity Tolerance: Patient tolerated treatment well;Other (comment)(cardiopulmonary status limiting) Patient left: in chair;with call bell/phone within reach Nurse Communication: Mobility status;Other (comment)(forehead probe for O2 ) PT Visit Diagnosis: Difficulty in walking, not elsewhere classified (R26.2)     Time: 5809-9833 PT Time Calculation (min) (ACUTE ONLY): 21 min  Charges:  $Gait Training: 8-22 mins                     Drucilla Chalet, PT  Pager: [phone] Acute Rehab Dept [facility]): [phone]   09/01/2018    [facility] 09/01/2018, 12:32 PM

## 2018-09-01 NOTE — Progress Notes (Addendum)
   09/01/18 0342  Vitals  Temp 98.1 F (36.7 C)  Temp Source Oral  BP (!) 168/86  MAP (mmHg) 110  BP Location Right Wrist  BP Method Automatic  Patient Position (if appropriate) Lying  Pulse Rate 75  ECG Heart Rate 77  Resp (!) 25  Oxygen Therapy  SpO2 90 %  O2 Device HFNC  O2 Flow Rate (L/min) 10 L/min  MEWS Score  MEWS RR 1  MEWS Pulse 0  MEWS Systolic 0  MEWS LOC 0  MEWS Temp 0  MEWS Score 1  MEWS Score Color Green  Dr Clearnce Sorrel Notified.No new orders at this time

## 2018-09-02 LAB — GLUCOSE, CAPILLARY
Glucose-Capillary: 128 mg/dL — ABNORMAL HIGH (ref 70–99)
Glucose-Capillary: 129 mg/dL — ABNORMAL HIGH (ref 70–99)
Glucose-Capillary: 119 mg/dL — ABNORMAL HIGH (ref 70–99)

## 2018-09-02 LAB — COMPREHENSIVE METABOLIC PANEL
ALT: 54 U/L — ABNORMAL HIGH (ref 0–44)
AST: 78 U/L — ABNORMAL HIGH (ref 15–41)
Albumin: 3.6 g/dL (ref 3.5–5.0)
Alkaline Phosphatase: 63 U/L (ref 38–126)
Anion gap: 15 (ref 5–15)
BUN: 30 mg/dL — ABNORMAL HIGH (ref 8–23)
CO2: 25 mmol/L (ref 22–32)
Calcium: 8.3 mg/dL — ABNORMAL LOW (ref 8.9–10.3)
Chloride: 100 mmol/L (ref 98–111)
Creatinine, Ser: 1.1 mg/dL (ref 0.61–1.24)
GFR calc Af Amer: >60 mL/min (ref >60)
GFR calc non Af Amer: >60 mL/min (ref >60)
Glucose, Bld: 124 mg/dL — ABNORMAL HIGH (ref 70–99)
Potassium: 3.9 mmol/L (ref 3.5–5.1)
Sodium: 140 mmol/L (ref 135–145)
Total Bilirubin: 0.8 mg/dL (ref 0.3–1.2)
Total Protein: 7.9 g/dL (ref 6.5–8.1)

## 2018-09-02 LAB — CBC WITH DIFFERENTIAL/PLATELET
Abs Immature Granulocytes: 0.15 10*3/uL — ABNORMAL HIGH (ref 0.00–0.07)
Basophils Absolute: 0 10*3/uL (ref 0.0–0.1)
Basophils Relative: 0 %
Eosinophils Absolute: 0 10*3/uL (ref 0.0–0.5)
Eosinophils Relative: 0 %
HCT: 34.6 % — ABNORMAL LOW (ref 39.0–52.0)
Hemoglobin: 11.9 g/dL — ABNORMAL LOW (ref 13.0–17.0)
Immature Granulocytes: 1 %
Lymphocytes Relative: 6 %
Lymphs Abs: 0.9 10*3/uL (ref 0.7–4.0)
MCH: 32 pg (ref 26.0–34.0)
MCHC: 34.4 g/dL (ref 30.0–36.0)
MCV: 93 fL (ref 80.0–100.0)
Monocytes Absolute: 1.1 10*3/uL — ABNORMAL HIGH (ref 0.1–1.0)
Monocytes Relative: 7 %
Neutro Abs: 12.8 10*3/uL — ABNORMAL HIGH (ref 1.7–7.7)
Neutrophils Relative %: 86 %
Platelets: 381 10*3/uL (ref 150–400)
RBC: 3.72 MIL/uL — ABNORMAL LOW (ref 4.22–5.81)
RDW: 13.5 % (ref 11.5–15.5)
WBC: 14.9 10*3/uL — ABNORMAL HIGH (ref 4.0–10.5)
nRBC: 0 % (ref 0.0–0.2)

## 2018-09-02 LAB — FERRITIN: Ferritin: 577 ng/mL — ABNORMAL HIGH (ref 24–336)

## 2018-09-02 LAB — C-REACTIVE PROTEIN: CRP: 2.5 mg/dL — ABNORMAL HIGH (ref <1.0)

## 2018-09-02 LAB — MAGNESIUM: Magnesium: 2.2 mg/dL (ref 1.7–2.4)

## 2018-09-02 LAB — D-DIMER, QUANTITATIVE: D-Dimer, Quant: 0.9 ug/mL-FEU — ABNORMAL HIGH (ref 0.00–0.50)

## 2018-09-02 MED ORDER — PREDNISONE 20 MG PO TABS
40.0000 mg | ORAL_TABLET | Freq: Two times a day (BID) | ORAL | Status: DC
  Administered 2018-09-02 – 2018-09-03 (×3): 40 mg via ORAL
  Filled 2018-09-02 (×2): qty 2
  Filled 2018-09-02: qty 4

## 2018-09-02 MED ORDER — METOPROLOL TARTRATE 5 MG/5ML IV SOLN: 2.5000 mg | Freq: Four times a day (QID) | INTRAVENOUS | Status: DC | PRN

## 2018-09-02 MED ORDER — MECLIZINE HCL 25 MG PO TABS
25.0000 mg | ORAL_TABLET | Freq: Three times a day (TID) | ORAL | Status: DC | PRN
  Administered 2018-09-02: 23:00:00 25 mg via ORAL
  Filled 2018-09-02 (×2): qty 1

## 2018-09-02 MED ORDER — POTASSIUM CHLORIDE CRYS ER 20 MEQ PO TBCR
40.0000 meq | EXTENDED_RELEASE_TABLET | Freq: Once | ORAL | Status: AC
  Administered 2018-09-02: 12:00:00 40 meq via ORAL
  Filled 2018-09-02: qty 2

## 2018-09-02 NOTE — Progress Notes (Signed)
Tele called stating patient was in a-fib again.  When staff arrived in room patient was standing at sink, leaning on the counter.  The water was running and patient was holding a cup in the sink.  Patient did not have his oxygen on.  His HR was 130-150s in a-fib.  Patient eyes were gazed at the wall and was not responding to anyone.  Care tech in room was speaking spanish to him, but still he was not responding.  Staff was able to bring him back to bed with max assist.  He was able to stand up, but was not moving.  Once he felt the bed behind him, patient lowered himself with assist into the bed.  He then responded to the spanish speaking tech.  Patient stated he was dizzy and wanted some water.  Patient also stated he could feel palpations in his chest.  Besides HR patients vitals were stable.  Patient was given water and PRN Antivert 25mg  PO for dizziness.  After a few minutes patient told  tech that he felt better.  After , patient is now calmly resting in bed.  Patient still in a-fib 100-120s. MD was paged and informed of situation.  Metoprolol 2.5mg  IV PRN Q6  was ordered for HR >100.  Will continue to monitor patient.

## 2018-09-02 NOTE — Progress Notes (Signed)
Bed alarm sounding off- find pt standing at [facility] having difficulty balancing himself. He had urinated all over himself & floor. Assisted back to bed & cleaned. Pt kept denying any problems. With the assistance of Spanish interpreter- pt c/o being cold and has a sore throat & headache. Reminded him to stay in bed and use call bell for assistance. Bed alarms ON. VSs as noted

## 2018-09-02 NOTE — Progress Notes (Signed)
Physical Therapy Treatment Patient Details Name: Craig Dorsey MRN: [id number] DOB: 04/03/1933 Today's Date: 09/02/2018    History of Present Illness 85 y.o. male who lives in [place] and is visiting family in the [place] admitted 09-02-2018 with fever, body aches.  Dx with acute hypoxic respiratory failure secondary to COVID 19 PNA, HTN.      PT Comments    The  Patient appears to not feel well, reports dizziness throughout mobility. BP 139/79, HR 115, O2 sats 87% on 3 L, RR 44 with  Ambulation. Continue PT for mobility and oxygen saturation monitoring. l  Follow Up Recommendations  No PT follow up;Supervision for mobility/OOB     Equipment Recommendations  Other (comment)    Recommendations for Other Services OT consult     Precautions / Restrictions Precautions Precaution Comments: monitor sats and  HR  And RR,    Mobility  Bed Mobility Overal bed mobility: Needs Assistance Bed Mobility: Supine to Sit     Supine to sit: Min assist     General bed mobility comments: slight assist with trunk.  Transfers Overall transfer level: Needs assistance Equipment used: Rolling walker (2 wheeled) Transfers: Sit to/from Stand           General transfer comment: patient appears weak, frowning, reports dizziness.  BP 139/79  Ambulation/Gait Ambulation/Gait assistance: Min assist Gait Distance (Feet): 50 Feet Assistive device: Rolling walker (2 wheeled)       General Gait Details: unsteady gait, On 3 L, sats dropp to 87%, HR115, RR 44. Patient sweaty. reports dizziness persists   Stairs             Wheelchair Mobility    Modified Rankin (Stroke Patients Only)       Balance     Sitting balance-Leahy Scale: Good     Standing balance support: Bilateral upper extremity supported;During functional activity Standing balance-Leahy Scale: Fair                              Cognition Arousal/Alertness: Awake/alert                                     General Comments: interpreter @# [id number]       Exercises      General Comments        Pertinent Vitals/Pain Faces Pain Scale: Hurts a little bit Pain Location: chest discomfort with breathing Pain Descriptors / Indicators: Discomfort Pain Intervention(s): Monitored during session    Home Living                      Prior Function            PT Goals (current goals can now be found in the care plan section) Progress towards PT goals: Progressing toward goals    Frequency    Min 5X/week      PT Plan Current plan remains appropriate    Co-evaluation              AM-PAC PT "6 Clicks" Mobility   Outcome Measure  Help needed turning from your back to your side while in a flat bed without using bedrails?: A Little Help needed moving from lying on your back to sitting on the side of a flat bed without using bedrails?: A Little Help needed moving to and from a bed  to a chair (including a wheelchair)?: A Little Help needed standing up from a chair using your arms (e.g., wheelchair or bedside chair)?: A Little Help needed to walk in hospital room?: A Lot Help needed climbing 3-5 steps with a railing? : A Lot 6 Click Score: 16    End of Session Equipment Utilized During Treatment: Oxygen;Gait belt Activity Tolerance: Patient limited by fatigue Patient left: with call bell/phone within reach;in bed;with chair alarm set Nurse Communication: Mobility status PT Visit Diagnosis: Difficulty in walking, not elsewhere classified (R26.2)     Time: 0937-1000 PT Time Calculation (min) (ACUTE ONLY): 23 min  Charges:  $Gait Training: 23-37 mins                      Blanchard KelchKaren Calhoun Reichardt PT Acute Rehabilitation Services Pager [phone] Office [phone]    Rada HayHill, Siddharth Babington Elizabeth 09/02/2018, 10:38 AM

## 2018-09-02 NOTE — Progress Notes (Addendum)
PROGRESS NOTE  Craig Dorsey LDJ:[id number] DOB: 02-07-1933 DOA: 09/21/2018  PCP: Patient, [name]  Brief History/Interval Summary: Patient is a 85 y.o. male with history of HTN, dyslipidemia-presented with myalgias, fever cough and shortness of breath-was found to have acute hypoxic respiratory failure secondary to COVID-19 pneumonia.  On admission patient was given a dose of Actemra, followed by another dose of Actemra on 5/6.  Hospital course complicated by continued worsening hypoxia requiring high flow O2.  Reason for Visit: Acute respiratory failure with hypoxia secondary to COVID-19  Consultants: None  Procedures: None  Antibiotics: None  Subjective/Interval History: Interpreter services were utilized.  Discussed with the nurse as well.  Patient apparently had a restless night.  He was not complaining of any specific issue.  Apparently had some difficulty balancing himself.  This morning he tells me that he is feeling well.  Apparently he was given Tylenol overnight.  He denies any complaints this morning.  Specifically denies any headache.  Shortness of breath is improved.  Cough is improving.  Denies any nausea vomiting or abdominal pain.   Assessment/Plan:  Acute Hypoxic Resp. Failure due to Acute Covid 19 Viral Illness during the ongoing 2020 Covid 19 Pandemic  COVID-19 Labs  Recent Labs    08/31/18 0402 09/01/18 0445 09/02/18 0440  DDIMER 1.03* 1.10* 0.90*  FERRITIN 436* 588* 577*  CRP 14.5* 5.5* 2.5*    Lab Results  Component Value Date   SARSCOV2NAA POSITIVE (A) 09/13/2018     Last episode of fever: 100.5 on 5/6.  T-max was 101.9 on 5/5. Oxygen requirements: Down to 3 L of oxygen by nasal cannula.  He was on 10 L of oxygen by high flow nasal cannula yesterday.  Saturations in the mid 90s. Antibiotics: None given during this hospital stay Steroids: Patient was placed on Solu-Medrol.  Will change to oral steroids today. Actemra: Patient was given 2 doses  (5/5 and 5/6) Convalescent Plasma: This was given on 5/7 after research protocol was followed by one of the investigators, Dr. Jerral Ralph. Vitramin C and Zinc: Continue for now Diuretics: Patient was given furosemide on 5/7 and then on 5/8.  I/O not charted accurately due to incontinence.  Prone positioning: Patient encouraged to stay in prone position as much as possible.  PT and OT is following.  Does not appear that he will need any home health.  Need home oxygen.  Inflammatory markers and d-dimer as above.  They are improving.  Patient's respiratory status appears to have improved after he was given the convalescent plasma on 5/7.  CRP is improved to 2.5.  D-dimer is 0.9.  Ferritin is 577.  Reason for his fatigue yesterday evening is not entirely clear.  Patient does not have any neurological deficits this morning.  He actually feels better.  His vital signs are all stable.  Continue to monitor for now.  LFTs are stable.     The treatment plan and use of medications and known side effects were discussed with patient/family. It was clearly explained that there is no proven definitive treatment for COVID-19 infection yet. Any medications used here are based on case reports/anecdotal data which are not peer-reviewed and has not been studied using randomized control trials.  Complete risks and long-term side effects are unknown, however in the best clinical judgment they seem to be of some clinical benefit rather than medical risks.  Patient/family agree with the treatment plan and want to receive these treatments as indicated.  Patient was given 2  doses of Actemra. And then enrolled into the research study for convalescent plasma, which was given on 5/7.  Essential hypertension Blood pressure noted to be elevated at times.  Elevated blood pressure could be due to steroids.  Home medication list does not list any home medications.  We will taper down steroids starting today.  Hopefully this will help  with his blood pressure.  If not he may need antihypertensive medications.  History of dyslipidemia Apparently on statins but again no medication listed on his home medication list.  Normocytic anemia No evidence of overt bleeding.  Continue to monitor.  Hemoglobin remains stable.   DVT Prophylaxis:  Lovenox  Lab Results  Component Value Date   PLT 381 09/02/2018     PUD Prophylaxis: Pepcid Code Status: Full code Family Communication: Discussed with his daughter yesterday.  Discussed in detail with patient.   Disposition Plan: Kidney management as outlined above.  Continue to mobilize.  Continue to wean down oxygen.   Medications:  Scheduled: . sodium chloride   Intravenous Once  . enoxaparin (LOVENOX) injection  40 mg Subcutaneous Q12H  . famotidine  20 mg Oral Daily  . insulin aspart  0-5 Units Subcutaneous QHS  . insulin aspart  0-9 Units Subcutaneous TID WC  . methylPREDNISolone (SOLU-MEDROL) injection  40 mg Intravenous Q6H  . potassium chloride  40 mEq Oral BID  . vitamin C  500 mg Oral Daily  . zinc sulfate  220 mg Oral Daily   Continuous:  ZOX:[id number], bisacodyl, guaiFENesin-dextromethorphan, HYDROcodone-acetaminophen, ipratropium, ondansetron **OR** ondansetron (ZOFRAN) IV, phenol, senna-docusate   Objective:  Vital Signs  Vitals:   09/02/18 0200 09/02/18 0243 09/02/18 0442 09/02/18 0800  BP:   138/78 (!) 152/85  Pulse:   (!) 112 88  Resp:    (!) 28  Temp: 98.1 F (36.7 C)  97.9 F (36.6 C) (!) 97.5 F (36.4 C)  TempSrc: Oral  Oral Oral  SpO2:  98% 96% 98%  Weight:      Height:        Intake/Output Summary (Last 24 hours) at 09/02/2018 0953 Last data filed at 09/02/2018 0500 Gross per 24 hour  Intake 120 ml  Output 0 ml  Net 120 ml   Filed Weights   09-26-2018 1600  Weight: 69.1 kg   General appearance: Awake alert.  In no distress Resp: Normal effort at rest.  Coarse breath sounds bilaterally.  Good air entry bilaterally.  No wheezing  or rhonchi. Cardio: S1-S2 is normal regular.  No S3-S4.  No rubs murmurs or bruit GI: Abdomen is soft.  Nontender nondistended.  Bowel sounds are present normal.  No masses organomegaly Extremities: No edema.  Full range of motion of lower extremities. Neurologic: He is alert.  Oriented to place contrary.  Did not know the year or the month.  No facial asymmetry.  Tongue is midline.  No pronator drift.  Strength is equal bilateral upper and lower extremities.  No nystagmus.    Lab Results:  Data Reviewed: I have personally reviewed following labs and imaging studies  CBC: Recent Labs  Lab 09/26/18 1114 08/30/18 0600 08/31/18 0402 09/01/18 0445 09/02/18 0440  WBC 8.3 7.4 3.6* 4.2 14.9*  NEUTROABS 6.6 5.4 2.0 3.7 12.8*  HGB 13.3 11.5* 11.2* 11.8* 11.9*  HCT 39.0 35.1* 33.2* 34.9* 34.6*  MCV 94.9 95.9 94.9 93.1 93.0  PLT 161 170 209 279 381    Basic Metabolic Panel: Recent Labs  Lab Sep 26, 2018 1114 08/30/18 0600 08/31/18 0402  09/01/18 0445 09/02/18 0440  NA 135 138 139 139 140  K 3.8 4.0 4.0 3.5 3.9  CL 103 105 107 102 100  CO2 GLUCOSE 128* 119* 99 151* 124*  BUN 30*  CREATININE 0.95 1.00 0.86 0.95 1.10  CALCIUM 8.0* 7.5* 7.6* 8.0* 8.3*  MG  --  1.9 2.3 2.0 2.2    GFR: Estimated Creatinine Clearance: 46.6 mL/min (by C-G formula based on SCr of 1.1 mg/dL).  Liver Function Tests: Recent Labs  Lab 09-28-18 1114 08/30/18 0600 08/31/18 0402 09/01/18 0445 09/02/18 0440  AST 44* 43* 54* 68* 78*  ALT 35 28 31 47* 54*  ALKPHOS 56 49 45 54 63  BILITOT 0.3 0.6 0.5 0.7 0.8  PROT 8.1 7.6 6.9 7.7 7.9  ALBUMIN 3.7 3.3* 2.9* 3.4* 3.6    CBG: Recent Labs  Lab 08/31/18 1653 08/31/18 1956 09/01/18 0810 09/01/18 1059 09/02/18 0801  GLUCAP 109* 125* 163* 189* 128*     Anemia Panel: Recent Labs    09/01/18 0445 09/02/18 0440  FERRITIN 588* 577*    Recent Results (from the past 240 hour(s))  Blood Culture (routine x 2)     Status:  None (Preliminary result)   Collection Time: 2018/09/28 10:57 AM  Result Value Ref Range Status   Specimen Description   Final    BLOOD LEFT ANTECUBITAL Performed at [facility], [address] [place], [place] [address]    Special Requests   Final    BOTTLES DRAWN AEROBIC AND ANAEROBIC Blood Culture results may not be optimal due to an excessive volume of blood received in culture bottles Performed at [facility], [address] [place], [place] [address]    Culture   Final    NO GROWTH 3 DAYS Performed at [facility], [address], [place], [place] [address]    Report Status PENDING  Incomplete  Blood Culture (routine x 2)     Status: None (Preliminary result)   Collection Time: 09/28/2018 11:02 AM  Result Value Ref Range Status   Specimen Description   Final    BLOOD RIGHT ANTECUBITAL Performed at [facility], [address], [place], [place] [address]    Special Requests   Final    BOTTLES DRAWN AEROBIC AND ANAEROBIC Blood Culture results may not be optimal due to an excessive volume of blood received in culture bottles Performed at [facility], [address], [place], [place] [address]    Culture   Final    NO GROWTH 3 DAYS Performed at [facility], [address], [place], [place] [address]    Report Status PENDING  Incomplete  SARS Coronavirus 2 (CEPHEID- Performed in [facility] hospital lab), Hosp Order     Status: Abnormal   Collection Time: 2018/09/28 11:20 AM  Result Value Ref Range Status   SARS Coronavirus 2 POSITIVE (A) NEGATIVE Final    Comment: RESULT CALLED TO, READ BACK BY AND VERIFIED WITH: K MERCHANT RN 2009 09/28/2018 A NAVARRO (NOTE) If result is NEGATIVE SARS-CoV-2 target nucleic acids are NOT DETECTED. The SARS-CoV-2 RNA is generally detectable in upper and lower  respiratory specimens during the acute phase of infection. The lowest  concentration of  SARS-CoV-2 viral copies this assay can detect is 250  copies / mL. A negative result does not preclude SARS-CoV-2 infection  and should not be used as the sole  basis for treatment or other  patient management decisions.  A negative result may occur with  improper specimen collection / handling, submission of specimen other  than nasopharyngeal swab, presence of viral mutation(s) within the  areas targeted by this assay, and inadequate number of viral copies  (<250 copies / mL). A negative result must be combined with clinical  observations, patient history, and epidemiological information. If result is POSITIVE SARS-CoV-2 target nucleic acids are DETECTED.  The SARS-CoV-2 RNA is generally detectable in upper and lower  respiratory specimens during the acute phase of infection.  Positive  results are indicative of active infection with SARS-CoV-2.  Clinical  correlation with patient history and other diagnostic information is  necessary to determine patient infection status.  Positive results do  not rule out bacterial infection or co-infection with other viruses. If result is PRESUMPTIVE POSTIVE SARS-CoV-2 nucleic acids MAY BE PRESENT.   A presumptive positive result was obtained on the submitted specimen  and confirmed on repeat testing.  While 2019 novel coronavirus  (SARS-CoV-2) nucleic acids may be present in the submitted sample  additional confirmatory testing may be necessary for epidemiological  and / or clinical management purposes  to differentiate between  SARS-CoV-2 and other Sarbecovirus currently known to infect humans.  If clinically indicated additional testing with an alternate test  methodology [phone])  is advised. The SARS-CoV-2 RNA is generally  detectable in upper and lower respiratory specimens during the acute  phase of infection. The expected result is Negative. Fact Sheet for Patients:  BoilerBrush.com.cyhttps://www.fda.gov/media/136312/download Fact Sheet for Healthcare  Providers: https://pope.com/https://www.fda.gov/media/136313/download This test is not yet approved or cleared by the [place] FDA and has been authorized for detection and/or diagnosis of SARS-CoV-2 by FDA under an Emergency Use Authorization (EUA).  This EUA will remain in effect (meaning this test can be used) for the duration of the COVID-19 declaration under Section 564(b)(1) of the Act, 21 U.S.C. section 360bbb-3(b)(1), unless the authorization is terminated or revoked sooner. Performed at [facility], [address] [place], [place] [address]   Urine Culture     Status: Abnormal   Collection Time: 2018/10/12  9:30 PM  Result Value Ref Range Status   Specimen Description   Final    URINE, RANDOM Performed at [facility], [address] [place], [place] [address]    Special Requests   Final    NONE Performed at [facility], [address] [place], [place] [address]    Culture MULTIPLE SPECIES PRESENT, SUGGEST RECOLLECTION (A)  Final   Report Status 08/31/2018 FINAL  Final      Radiology Studies: Dg Chest Port 1v Same Day  Result Date: 08/31/2018 CLINICAL DATA:  Shortness of breath.  COVID-19 EXAM: PORTABLE CHEST 1 VIEW COMPARISON:  Two days ago FINDINGS: Worsening bilateral airspace disease compatible with ARDS in this clinical setting. Cardiomegaly. No effusion or pneumothorax. IMPRESSION: ARDS with worsening airspace disease. Electronically Signed   By: Marnee SpringJonathon  Watts M.D.   On: 08/31/2018 11:05       LOS: 4 days   Mahlet Jergens Rito EhrlichKrishnan  [facility] Pager on [email or web address]  09/02/2018, 9:53 AM

## 2018-09-02 NOTE — Progress Notes (Signed)
  PT Cancellation Note  Patient Details Name: Craig Dorsey MRN: [id number] DOB: August 10, 1932   Cancelled Treatment:    Reason Eval/Treat Not Completed: Medical issues which prohibited therapy, RN reports that patient has been feeling poorly since this AM. Continue PT as tolerated.   Rada Hay 09/02/2018, 3:17 PM Blanchard Kelch PT Acute Rehabilitation Services Pager [phone] Office [phone]

## 2018-09-02 NOTE — Progress Notes (Deleted)
SATURATION QUALIFICATIONS: (This note is used to comply with regulatory documentation for home oxygen)  Patient Saturations on Room Air at Rest = 89%  Patient Saturations on Room Air while Ambulating = 88%  Patient Saturations on 2 Liters of oxygen while Ambulating = 92%

## 2018-09-03 ENCOUNTER — Inpatient Hospital Stay (HOSPITAL_COMMUNITY): Payer: Medicaid Other

## 2018-09-03 DIAGNOSIS — I480 Paroxysmal atrial fibrillation: Secondary | ICD-10-CM | POA: Diagnosis not present

## 2018-09-03 LAB — CULTURE, BLOOD (ROUTINE X 2)
Culture: NO GROWTH
Culture: NO GROWTH

## 2018-09-03 LAB — GLUCOSE, CAPILLARY
Glucose-Capillary: 139 mg/dL — ABNORMAL HIGH (ref 70–99)
Glucose-Capillary: 136 mg/dL — ABNORMAL HIGH (ref 70–99)
Glucose-Capillary: 129 mg/dL — ABNORMAL HIGH (ref 70–99)

## 2018-09-03 LAB — COMPREHENSIVE METABOLIC PANEL
ALT: 133 U/L — ABNORMAL HIGH (ref 0–44)
AST: 154 U/L — ABNORMAL HIGH (ref 15–41)
Albumin: 3.7 g/dL (ref 3.5–5.0)
Alkaline Phosphatase: 71 U/L (ref 38–126)
Anion gap: 14 (ref 5–15)
BUN: 43 mg/dL — ABNORMAL HIGH (ref 8–23)
CO2: 23 mmol/L (ref 22–32)
Calcium: 8.2 mg/dL — ABNORMAL LOW (ref 8.9–10.3)
Chloride: 103 mmol/L (ref 98–111)
Creatinine, Ser: 1.47 mg/dL — ABNORMAL HIGH (ref 0.61–1.24)
GFR calc Af Amer: 50 mL/min — ABNORMAL LOW (ref >60)
GFR calc non Af Amer: 43 mL/min — ABNORMAL LOW (ref >60)
Glucose, Bld: 124 mg/dL — ABNORMAL HIGH (ref 70–99)
Potassium: 4.5 mmol/L (ref 3.5–5.1)
Sodium: 140 mmol/L (ref 135–145)
Total Bilirubin: 0.8 mg/dL (ref 0.3–1.2)
Total Protein: 7.7 g/dL (ref 6.5–8.1)

## 2018-09-03 LAB — C-REACTIVE PROTEIN: CRP: 1.5 mg/dL — ABNORMAL HIGH (ref <1.0)

## 2018-09-03 LAB — BRAIN NATRIURETIC PEPTIDE: B Natriuretic Peptide: 524.3 pg/mL — ABNORMAL HIGH (ref 0.0–100.0)

## 2018-09-03 MED ORDER — SODIUM CHLORIDE 0.9 % IV BOLUS
250.0000 mL | Freq: Once | INTRAVENOUS | Status: AC
  Administered 2018-09-03: 01:00:00 250 mL via INTRAVENOUS

## 2018-09-03 MED ORDER — FUROSEMIDE 10 MG/ML IJ SOLN
60.0000 mg | Freq: Once | INTRAMUSCULAR | Status: AC
  Administered 2018-09-03: 08:00:00 60 mg via INTRAVENOUS
  Filled 2018-09-03: qty 6

## 2018-09-03 MED ORDER — METOPROLOL TARTRATE 25 MG PO TABS
25.0000 mg | ORAL_TABLET | Freq: Two times a day (BID) | ORAL | Status: DC
  Administered 2018-09-03 (×2): 25 mg via ORAL
  Filled 2018-09-03 (×2): qty 1

## 2018-09-03 NOTE — Progress Notes (Signed)
In bed resting at this time- sats remain upper 80's- oxygen increased to 10l [place]. Heart rate running between 80's and 105- still in afib- rechecked bp- reading 84/48- notified on call phys- will infuse 250 cc ns bolus- and obtain a chest xray- both of which have been completed- bp upon bolus completion-  96/68. Pt in no acute distress.

## 2018-09-03 NOTE — Progress Notes (Addendum)
Occupational Therapy Treatment Patient Details Name: Craig Dorsey MRN: [id number] DOB: 08/30/1932 Today's Date: 09/03/2018    History of present illness 85 y.o. male who lives in [place] and is visiting [place] admitted 09/02/2018 with fever, body aches.  Dx with acute hypoxic respiratory failure secondary to COVID 19 PNA, HTN.     OT comments  Pt presents supine in bed, reports not feeling well today and requires some encouragement for participation in therapy session. Pt tolerates sitting EOB approx 10-15 min during session. Pt engaging in UB ADL and seated grooming ADL with overall setup/minguard assist for sitting balance/safety. Pt fatigued with activity and requesting to return to supine after completion of ADL tasks. BP taken start of session as pt reports dizziness and reading 140/85, SpO2 maintaining >90% on 15L during session. Feel POC remains appropriate at this time. Will continue to follow acutely to progress pt towards established OT goals.   [employer] Interpreter usedEvlyn Kanner: Roxana ID# [id number]   Follow Up Recommendations  No OT follow up;Supervision - Intermittent    Equipment Recommendations  None recommended by OT          Precautions / Restrictions Precautions Precautions: Other (comment) Precaution Comments: monitor sats and  HR Restrictions Weight Bearing Restrictions: No       Mobility Bed Mobility Overal bed mobility: Needs Assistance Bed Mobility: Supine to Sit;Sit to Supine     Supine to sit: Min assist Sit to supine: Min assist   General bed mobility comments: assist for trunk upright; light assist for LEs when returning to supine  Transfers                      Balance Overall balance assessment: Needs assistance Sitting-balance support: Feet supported;No upper extremity supported Sitting balance-Leahy Scale: Good                                     ADL either performed or assessed with clinical  judgement   ADL Overall ADL's : Needs assistance/impaired     Grooming: Set up;Sitting;Wash/dry face;Oral care;Min guard Grooming Details (indicate cue type and reason): sitting EOB Upper Body Bathing: Min guard;Set up;Sitting                             General ADL Comments: pt reports not feeling well today, requires encouragement to participate but ultimately agreeable to performing ADL; pt sat EOB for grooming and UB ADL, declined performing LB ADL due to feeling "cold" and with increased fatigue during UB ADL tasks      Vision       Perception     Praxis      Cognition Arousal/Alertness: Awake/alert Behavior During Therapy: WFL for tasks assessed/performed Overall Cognitive Status: Within Functional Limits for tasks assessed                                          Exercises     Shoulder Instructions       General Comments pt reports dizziness today, BP 140/85 while supine in bed, reports dizziness subsides while sitting up; pt on 15L O2 during session with SpO2 maintaining >90% (once able to obtain clear/accurate reading - required increased time to do so, using pulse-ox on  forehead)    Pertinent Vitals/ Pain       Pain Assessment: Faces Faces Pain Scale: Hurts little more Pain Location: generalized, not feeling well today Pain Descriptors / Indicators: Discomfort Pain Intervention(s): Monitored during session  Home Living                                          Prior Functioning/Environment              Frequency  Min 3X/week        Progress Toward Goals  OT Goals(current goals can now be found in the care plan section)  Progress towards OT goals: Progressing toward goals  Acute Rehab OT Goals Patient Stated Goal: to feel better again, get home. OT Goal Formulation: With patient Time For Goal Achievement: 09/15/18 Potential to Achieve Goals: Good  Plan Discharge plan remains appropriate     Co-evaluation                 AM-PAC OT "6 Clicks" Daily Activity     Outcome Measure   Help from another person eating meals?: None Help from another person taking care of personal grooming?: A Little Help from another person toileting, which includes using toliet, bedpan, or urinal?: A Little Help from another person bathing (including washing, rinsing, drying)?: A Little Help from another person to put on and taking off regular upper body clothing?: None Help from another person to put on and taking off regular lower body clothing?: A Little 6 Click Score: 20    End of Session Equipment Utilized During Treatment: Oxygen  OT Visit Diagnosis: Unsteadiness on feet (R26.81);Other (comment)(poor activity tolerance)   Activity Tolerance Patient tolerated treatment well;Patient limited by fatigue   Patient Left in bed;with call bell/phone within reach   Nurse Communication Mobility status        Time: 5361-4431 OT Time Calculation (min): 27 min  Charges: OT General Charges $OT Visit: 1 Visit OT Treatments $Self Care/Home Management : 23-37 mins  Marcy Siren, OT Supplemental Rehabilitation Services Pager [phone] Office [phone]    Orlando Penner 09/03/2018, 1:41 PM

## 2018-09-03 NOTE — Progress Notes (Signed)
Patient resting comfortably in bed, reports "I'm feeling a bit better." Denies SOB or DOB. Patient currently on room air, o2 sats reading 100% by forehead monitor.  O2 sat monitor had difficulty reading patient. Conferred w/ charge nurse okay to decrease oxygen as tolerated based on sat reading. Patient so far tolerating well. Will cont to monitor closely for s/s distress or intolerance of room air status.

## 2018-09-03 NOTE — Progress Notes (Signed)
Heart rhythm returned to sinus- rate dropped down to 60's- remains challenged to maintain sats- tried to have him move to prone position he didn't quite get completely to the prone position.

## 2018-09-03 NOTE — Progress Notes (Addendum)
PROGRESS NOTE  Craig Dorsey ZOX:[id number] DOB: 09/14/1932 DOA: 09/09/2018  PCP: Patient, [name]  Brief History/Interval Summary: Patient is a 85 y.o. male with history of HTN, dyslipidemia-presented with myalgias, fever cough and shortness of breath-was found to have acute hypoxic respiratory failure secondary to COVID-19 pneumonia.  On admission patient was given a dose of Actemra, followed by another dose of Actemra on 5/6.  Hospital course complicated by continued worsening hypoxia requiring high flow O2.  Reason for Visit: Acute respiratory failure with hypoxia secondary to COVID-19  Consultants: None  Procedures: None  Antibiotics: None  Subjective/Interval History: Interpreter services were utilized.  Overnight events noted.  Patient went into atrial fibrillation with RVR.  Patient was given IV metoprolol with which he converted back to sinus rhythm.  Noted to be in sinus rhythm this morning.  Patient poor historian.  He admitted to some chest pressure overnight with seems to be better this morning.  Also mentions some dizziness overnight. Has been coughing.    Assessment/Plan:  Acute Hypoxic Resp. Failure due to Acute Covid 19 Viral Illness during the ongoing 2020 Covid 19 Pandemic  COVID-19 Labs  Recent Labs    09/01/18 0445 09/02/18 0440 09/03/18 0400  DDIMER 1.10* 0.90*  --   FERRITIN 588* 577*  --   CRP 5.5* 2.5* 1.5*    Lab Results  Component Value Date   SARSCOV2NAA POSITIVE (A) 01/10/2019     Last episode of fever: 100.5 on 5/6.  T-max was 101.9 on 5/5. Oxygen requirements: Overnight his oxygen requirements have gone up.  He is currently on 15 L by high flow nasal cannula.  Saturations in the 80s.   Antibiotics: None given during this hospital stay Steroids: Currently on oral steroids. Actemra: Patient was given 2 doses (5/5 and 5/6) Convalescent Plasma: This was given on 5/7 after research protocol was followed by one of the investigators, Dr.  Jerral RalphGhimire. Vitramin C and Zinc: Continue for now Diuretics: Patient was given furosemide on 5/7 and then on 5/8.  Additional dose to be given today.  Prone positioning: Patient encouraged to stay in prone position as much as possible.  PT/OT has been following.  Patient has had a setback overnight.  Continue to monitor closely.  Inflammatory markers and d-dimer as above.   Appears to have had a setback overnight.  He has become more hypoxic.  Chest x-ray suggests pulmonary edema.  Patient will be given furosemide.  CRP is improved 1.5.  D-dimer was less than 1.  LFTs noted to be elevated.  Chest discomfort was likely due to tachyarrhythmia.  Continue to monitor for now.   The treatment plan and use of medications and known side effects were discussed with patient/family. It was clearly explained that there is no proven definitive treatment for COVID-19 infection yet. Any medications used here are based on case reports/anecdotal data which are not peer-reviewed and has not been studied using randomized control trials.  Complete risks and long-term side effects are unknown, however in the best clinical judgment they seem to be of some clinical benefit rather than medical risks.  Patient/family agree with the treatment plan and want to receive these treatments as indicated.  Patient was given 2 doses of Actemra. And then enrolled into the research study for convalescent plasma, which was given on 5/7.  Paroxysmal atrial fibrillation Patient went into atrial fibrillation overnight.  Was given metoprolol with conversion back to sinus rhythm.  Noted to be on sinus rhythm this morning on  telemetry.  Place him on oral metoprolol.  Will check TSH if not done so recently.  Since this was a very brief episode he likely does not need anticoagulation. Aspirin can be considered at DC.  Essential hypertension Noted to have high blood pressures during the course of this hospital stay.  While he was in atrial  fibrillation he was noted to be hypotensive.  Blood pressures have improved this morning.  Continue to monitor.  He will be placed on metoprolol as discussed above.    History of dyslipidemia Apparently on statins but again no medication listed on his home medication list.  Normocytic anemia No evidence of overt bleeding.  Continue to monitor.  Hemoglobin remains stable.   DVT Prophylaxis:  Lovenox 40 mg subcu q12h  Lab Results  Component Value Date   PLT 381 09/02/2018     PUD Prophylaxis: Pepcid Code Status: Full code Family Communication: Discussed with his daughter yesterday.  Discussed in detail with patient.   Disposition Plan: Kidney management as outlined above.  Continue to mobilize.  Continue to wean down oxygen.   Medications:  Scheduled: . sodium chloride   Intravenous Once  . enoxaparin (LOVENOX) injection  40 mg Subcutaneous Q12H  . famotidine  20 mg Oral Daily  . insulin aspart  0-5 Units Subcutaneous QHS  . insulin aspart  0-9 Units Subcutaneous TID WC  . predniSONE  40 mg Oral BID WC  . vitamin C  500 mg Oral Daily  . zinc sulfate  220 mg Oral Daily   Continuous:  ZOX:[id number], bisacodyl, guaiFENesin-dextromethorphan, HYDROcodone-acetaminophen, ipratropium, meclizine, metoprolol tartrate, ondansetron **OR** ondansetron (ZOFRAN) IV, phenol, senna-docusate   Objective:  Vital Signs  Vitals:   09/03/18 0007 09/03/18 0429 09/03/18 0700 09/03/18 0900  BP:    137/67  Pulse: 92  64   Resp: (!) 29  10   Temp:  98.7 F (37.1 C)    TempSrc:  Axillary    SpO2: (!) 85%  96%   Weight:      Height:        Intake/Output Summary (Last 24 hours) at 09/03/2018 0936 Last data filed at 09/02/2018 1700 Gross per 24 hour  Intake 120 ml  Output 850 ml  Net -730 ml   Filed Weights   08/28/2018 1600  Weight: 69.1 kg   General appearance: Awake alert.  In no distress Resp: Crackles heard bilaterally.  No wheezing or rhonchi.  Tachypneic at rest.  No use of  accessory muscles.   Cardio: S1-S2 is normal regular.  No S3-S4.  No rubs murmurs or bruit.  Telemetry showed atrial fibrillation overnight. GI: Abdomen is soft.  Nontender nondistended.  Bowel sounds are present normal.  No masses organomegaly Extremities: No edema.  Full range of motion of lower extremities. Neurologic: No focal neurological deficits.   Lab Results:  Data Reviewed: I have personally reviewed following labs and imaging studies  CBC: Recent Labs  Lab 09/09/2018 1114 08/30/18 0600 08/31/18 0402 09/01/18 0445 09/02/18 0440  WBC 8.3 7.4 3.6* 4.2 14.9*  NEUTROABS 6.6 5.4 2.0 3.7 12.8*  HGB 13.3 11.5* 11.2* 11.8* 11.9*  HCT 39.0 35.1* 33.2* 34.9* 34.6*  MCV 94.9 95.9 94.9 93.1 93.0  PLT 161 170 209 279 381    Basic Metabolic Panel: Recent Labs  Lab 08/30/18 0600 08/31/18 0402 09/01/18 0445 09/02/18 0440 09/03/18 0400  NA 138 139 139 140 140  K 4.0 4.0 3.5 3.9 4.5  CL 105 107 102 100 103  CO2  GLUCOSE 119* 99 151* 124* 124*  BUN 30* 43*  CREATININE 1.00 0.86 0.95 1.10 1.47*  CALCIUM 7.5* 7.6* 8.0* 8.3* 8.2*  MG 1.9 2.3 2.0 2.2  --     GFR: Estimated Creatinine Clearance: 34.9 mL/min (A) (by C-G formula based on SCr of 1.47 mg/dL (H)).  Liver Function Tests: Recent Labs  Lab 08/30/18 0600 08/31/18 0402 09/01/18 0445 09/02/18 0440 09/03/18 0400  AST 43* 54* 68* 78* 154*  ALT 28 31 47* 54* 133*  ALKPHOS 49 45 54 63 71  BILITOT 0.6 0.5 0.7 0.8 0.8  PROT 7.6 6.9 7.7 7.9 7.7  ALBUMIN 3.3* 2.9* 3.4* 3.6 3.7    CBG: Recent Labs  Lab 09/01/18 0810 09/01/18 1059 09/02/18 0801 09/02/18 1709 09/02/18 2034  GLUCAP 163* 189* 128* 129* 119*     Anemia Panel: Recent Labs    09/01/18 0445 09/02/18 0440  FERRITIN 588* 577*    Recent Results (from the past 240 hour(s))  Blood Culture (routine x 2)     Status: None   Collection Time: 09/22/2018 10:57 AM  Result Value Ref Range Status   Specimen Description   Final     BLOOD LEFT ANTECUBITAL Performed at [facility], [address] [place], [place] [address]    Special Requests   Final    BOTTLES DRAWN AEROBIC AND ANAEROBIC Blood Culture results may not be optimal due to an excessive volume of blood received in culture bottles Performed at [facility], [address], [place], [place] [address]    Culture   Final    NO GROWTH 5 DAYS Performed at [facility], [address], [place], [place] [address]    Report Status 09/03/2018 FINAL  Final  Blood Culture (routine x 2)     Status: None   Collection Time: 09-22-18 11:02 AM  Result Value Ref Range Status   Specimen Description   Final    BLOOD RIGHT ANTECUBITAL Performed at [facility], [address], [place], [place] [address]    Special Requests   Final    BOTTLES DRAWN AEROBIC AND ANAEROBIC Blood Culture results may not be optimal due to an excessive volume of blood received in culture bottles Performed at [facility], [address] [place], [place] [address]    Culture   Final    NO GROWTH 5 DAYS Performed at [facility], [address], [place], [place] [address]    Report Status 09/03/2018 FINAL  Final  SARS Coronavirus 2 (CEPHEID- Performed in [facility] hospital lab), Hosp Order     Status: Abnormal   Collection Time: 22-Sep-2018 11:20 AM  Result Value Ref Range Status   SARS Coronavirus 2 POSITIVE (A) NEGATIVE Final    Comment: RESULT CALLED TO, READ BACK BY AND VERIFIED WITH: Jamal Maes RN 2009 Sep 22, 2018 A NAVARRO (NOTE) If result is NEGATIVE SARS-CoV-2 target nucleic acids are NOT DETECTED. The SARS-CoV-2 RNA is generally detectable in upper and lower  respiratory specimens during the acute phase of infection. The lowest  concentration of SARS-CoV-2 viral copies this assay can detect is 250  copies / mL. A negative result does not preclude SARS-CoV-2 infection  and should not be used as  the sole basis for treatment or other  patient management decisions.  A negative result may occur with  improper specimen collection / handling, submission of specimen other  than nasopharyngeal swab, presence of viral mutation(s) within the  areas targeted by this assay, and inadequate number of viral copies  (<250 copies / mL). A negative result must be combined with clinical  observations, patient history, and epidemiological information. If result is POSITIVE SARS-CoV-2 target nucleic acids are DETECTED.  The SARS-CoV-2 RNA is generally detectable in upper and lower  respiratory specimens during the acute phase of infection.  Positive  results are indicative of active infection with SARS-CoV-2.  Clinical  correlation with patient history and other diagnostic information is  necessary to determine patient infection status.  Positive results do  not rule out bacterial infection or co-infection with other viruses. If result is PRESUMPTIVE POSTIVE SARS-CoV-2 nucleic acids MAY BE PRESENT.   A presumptive positive result was obtained on the submitted specimen  and confirmed on repeat testing.  While 2019 novel coronavirus  (SARS-CoV-2) nucleic acids may be present in the submitted sample  additional confirmatory testing may be necessary for epidemiological  and / or clinical management purposes  to differentiate between  SARS-CoV-2 and other Sarbecovirus currently known to infect humans.  If clinically indicated additional testing with an alternate test  methodology [phone])  is advised. The SARS-CoV-2 RNA is generally  detectable in upper and lower respiratory specimens during the acute  phase of infection. The expected result is Negative. Fact Sheet for Patients:  BoilerBrush.com.cy Fact Sheet for Healthcare Providers: https://pope.com/ This test is not yet approved or cleared by the [place] FDA and has been authorized for  detection and/or diagnosis of SARS-CoV-2 by FDA under an Emergency Use Authorization (EUA).  This EUA will remain in effect (meaning this test can be used) for the duration of the COVID-19 declaration under Section 564(b)(1) of the Act, 21 U.S.C. section 360bbb-3(b)(1), unless the authorization is terminated or revoked sooner. Performed at [facility], [address], [place], [place] [address]   Urine Culture     Status: Abnormal   Collection Time: 09/01/2018  9:30 PM  Result Value Ref Range Status   Specimen Description   Final    URINE, RANDOM Performed at [facility], [address] [place], [place] [address]    Special Requests   Final    NONE Performed at [facility], [address], [place], [place] [address]    Culture MULTIPLE SPECIES PRESENT, SUGGEST RECOLLECTION (A)  Final   Report Status 08/31/2018 FINAL  Final      Radiology Studies: Dg Chest 1 View  Result Date: 09/03/2018 CLINICAL DATA:  85 year old male with respiratory abnormality. Low saturation. EXAM: CHEST  1 VIEW COMPARISON:  Chest radiograph dated 09/13/2018 FINDINGS: Diffuse interstitial coarsening and prominence with interval worsening since the study of 09/17/2018 may represent edema or atypical infection. Clinical correlation is recommended. No pleural effusion or pneumothorax. The right costophrenic angle has been excluded from the image. There is cardiomegaly. No acute osseous pathology. IMPRESSION: Overall interval worsening of the diffuse interstitial densities which may represent edema or atypical infection. Electronically Signed   By: Elgie Collard M.D.   On: 09/03/2018 01:23       LOS: 5 days   Jaanai Salemi Rito Ehrlich  [facility] Pager on [email or web address]  09/03/2018, 9:36 AM

## 2018-09-03 NOTE — Progress Notes (Signed)
Vs appear to be stable at this time with exception of respiration rate running around 35- and noted retractions- o2 sats reading mid 90's- telemetry reports occasional runs svt- 5 beats vt- pt just appear5s washed out- also concerned with nutritional intake- I was able to get him to drink 3/4 carton of glucerna.

## 2018-09-04 DIAGNOSIS — I10 Essential (primary) hypertension: Secondary | ICD-10-CM

## 2018-09-04 DIAGNOSIS — J9601 Acute respiratory failure with hypoxia: Secondary | ICD-10-CM

## 2018-09-04 DIAGNOSIS — R0902 Hypoxemia: Secondary | ICD-10-CM

## 2018-09-04 DIAGNOSIS — U071 COVID-19: Principal | ICD-10-CM

## 2018-09-04 LAB — POCT I-STAT 7, (LYTES, BLD GAS, ICA,H+H)
Acid-base deficit: 8 mmol/L — ABNORMAL HIGH (ref 0.0–2.0)
Bicarbonate: 15.9 mmol/L — ABNORMAL LOW (ref 20.0–28.0)
Calcium, Ion: 1.04 mmol/L — ABNORMAL LOW (ref 1.15–1.40)
HCT: 34 % — ABNORMAL LOW (ref 39.0–52.0)
Hemoglobin: 11.6 g/dL — ABNORMAL LOW (ref 13.0–17.0)
O2 Saturation: 70 %
Patient temperature: 98
Potassium: 4.4 mmol/L (ref 3.5–5.1)
Sodium: 136 mmol/L (ref 135–145)
TCO2: 17 mmol/L — ABNORMAL LOW (ref 22–32)
pCO2 arterial: 25.8 mmHg — ABNORMAL LOW (ref 32.0–48.0)
pH, Arterial: 7.398 (ref 7.350–7.450)
pO2, Arterial: 35 mmHg — CL (ref 83.0–108.0)

## 2018-09-04 LAB — GLUCOSE, CAPILLARY
Glucose-Capillary: 155 mg/dL — ABNORMAL HIGH (ref 70–99)
Glucose-Capillary: 142 mg/dL — ABNORMAL HIGH (ref 70–99)
Glucose-Capillary: 139 mg/dL — ABNORMAL HIGH (ref 70–99)
Glucose-Capillary: 138 mg/dL — ABNORMAL HIGH (ref 70–99)

## 2018-09-12 ENCOUNTER — Inpatient Hospital Stay: Payer: Self-pay | Admitting: Critical Care Medicine

## 2018-09-25 NOTE — Progress Notes (Signed)
alerted by telemetry that heart rate dropped- when I went to check found tech in room trying to get him to respond- including sternal rubbing- checked for pulse- no pulse CPR started- approx 0225- and continued till 0256- after staff speaking to family and decision was reached to halt further intervention- and time of death ws prononuncd at 0256- see written  CPR records

## 2018-09-25 NOTE — Progress Notes (Signed)
ABG results reported to RN Kizzie Bane.   Ph7.39 Co2 36 Po2 36 HC03 15.9 sO2 70%

## 2018-09-25 NOTE — Procedures (Signed)
Intubation Procedure Note Gari Hartsell [id number] 06/14/1932  Procedure: Intubation Indications: Respiratory insufficiency  Procedure Details Consent: Unable to obtain consent because of emergent medical necessity. Time Out: Verified patient identification, verified procedure, site/side was marked, verified correct patient position, special equipment/implants available, medications/allergies/relevent history reviewed, required imaging and test results available.  Performed  Maximum sterile technique was used including cap, gloves, gown, hand hygiene and mask.  MAC and 3 Patient intubated emergently during Code Blue.  2 attempts with size 7.5 ETT, tube visualized through vocal cords, with color change on end tidal CO2 detector, bilateral breath sounds.    Evaluation Hemodynamic Status: Persistent hypotension treated with fluid; O2 sats: currently acceptable Patient's Current Condition: unstable Complications: No apparent complications Patient did tolerate procedure well. Chest X-ray ordered to verify placement.  CXR: tube position acceptable.   Kellie Moor 2018/09/12

## 2018-09-25 NOTE — Death Summary Note (Signed)
Death Summary  Craig Dorsey ZOX:[id number] DOB: [birth year] DOA: 30-Aug-2018  PCP: Patient, [name]  Admit date: Aug 30, 2018 Date of Death: 05-Sep-2018 Time of Death: 02:56 Notification: Patient, [name] notified of death of 09/05/18   History of present illness:  Craig Dorsey is a 85 y.o. male with a history of hypertension and possibly other medical illnesses, although medical history limited who is from [place] and visiting his daughter admitted on 5/1 with a one-week history of body aches, fevers and chills for the past 7 days and came to the ER with persistence of symptoms and found to be COVID positive.  At that time, he was also found to have acute respiratory failure secondary to hypoxia, with oxygen saturations at 85% requiring additional oxygenation.  Following admission, he was given a dose of Actmera and then a second dose on 5/6.  Hospital course complicated by worsening hypoxia requiring high flow oxygen.  On early morning of 5/10, patient went into rapid atrial fibrillation and then given IV metoprolol and converted back to normal sinus rhythm.  On the late night of 5/10, nursing noted patient appeared to be tachypneic despite good oxygen saturations.  ABG ordered and came back with market hypoxia.  At approximately 2:30 AM on early morning of Sep 05, 2022, patient went into respiratory arrest.  CODE BLUE initiated.  Despite life-saving attempts with epinephrine x3 given, patient remained asystole.  After speaking with patient's daughter, further resuscitation efforts stopped.  Patient expired at 2:56 AM.  Final Diagnoses:  1.  Acute respiratory failure with hypoxia secondary to COVID-19 2.  Paroxysmal atrial fibrillation 3.  Essential hypertension  Critical care time spent: 25 minutes    The results of significant diagnostics from this hospitalization (including imaging, microbiology, ancillary and laboratory) are listed below for reference.    Significant Diagnostic  Studies: Dg Chest 1 View  Result Date: 09/03/2018 CLINICAL DATA:  85 year old male with respiratory abnormality. Low saturation. EXAM: CHEST  1 VIEW COMPARISON:  Chest radiograph dated 08-30-2018 FINDINGS: Diffuse interstitial coarsening and prominence with interval worsening since the study of 2018-08-30 may represent edema or atypical infection. Clinical correlation is recommended. No pleural effusion or pneumothorax. The right costophrenic angle has been excluded from the image. There is cardiomegaly. No acute osseous pathology. IMPRESSION: Overall interval worsening of the diffuse interstitial densities which may represent edema or atypical infection. Electronically Signed   By: Elgie Collard M.D.   On: 09/03/2018 01:23   Dg Chest Port 1 View  Result Date: 08-30-2018 CLINICAL DATA:  Body aches since Wednesday EXAM: PORTABLE CHEST 1 VIEW COMPARISON:  None. FINDINGS: Bilateral airspace opacity, likely atypical infection in this setting. There are likely calcified pulmonary nodules and hilar lymph nodes on the right. Mild cardiomegaly. No effusion or pneumothorax. IMPRESSION: Bilateral infiltrate primarily concerning for atypical infection. Electronically Signed   By: Marnee Spring M.D.   On: Aug 30, 2018 11:40   Dg Chest Port 1v Same Day  Result Date: 08/31/2018 CLINICAL DATA:  Shortness of breath.  COVID-19 EXAM: PORTABLE CHEST 1 VIEW COMPARISON:  Two days ago FINDINGS: Worsening bilateral airspace disease compatible with ARDS in this clinical setting. Cardiomegaly. No effusion or pneumothorax. IMPRESSION: ARDS with worsening airspace disease. Electronically Signed   By: Marnee Spring M.D.   On: 08/31/2018 11:05    Microbiology: Recent Results (from the past 240 hour(s))  Blood Culture (routine x 2)     Status: None   Collection Time: 08-30-2018 10:57 AM  Result Value Ref Range Status  Specimen Description   Final    BLOOD LEFT ANTECUBITAL Performed at [facility], [address] [place], [place] [address]    Special Requests   Final    BOTTLES DRAWN AEROBIC AND ANAEROBIC Blood Culture results may not be optimal due to an excessive volume of blood received in culture bottles Performed at [facility], [address] [place], [place] [address]    Culture   Final    NO GROWTH 5 DAYS Performed at [facility], [address] [place], [place] [address]    Report Status 09/03/2018 FINAL  Final  Blood Culture (routine x 2)     Status: None   Collection Time: 09/01/2018 11:02 AM  Result Value Ref Range Status   Specimen Description   Final    BLOOD RIGHT ANTECUBITAL Performed at [facility], [address], [place], [place] [address]    Special Requests   Final    BOTTLES DRAWN AEROBIC AND ANAEROBIC Blood Culture results may not be optimal due to an excessive volume of blood received in culture bottles Performed at [facility], [address], [place], [place] [address]    Culture   Final    NO GROWTH 5 DAYS Performed at [facility], [address], [place], [place] [address]    Report Status 09/03/2018 FINAL  Final  SARS Coronavirus 2 (CEPHEID- Performed in [facility] hospital lab), Hosp Order     Status: Abnormal   Collection Time: 09/02/2018 11:20 AM  Result Value Ref Range Status   SARS Coronavirus 2 POSITIVE (A) NEGATIVE Final    Comment: RESULT CALLED TO, READ BACK BY AND VERIFIED WITH: Jamal Maes RN 2009 09/01/2018 A NAVARRO (NOTE) If result is NEGATIVE SARS-CoV-2 target nucleic acids are NOT DETECTED. The SARS-CoV-2 RNA is generally detectable in upper and lower  respiratory specimens during the acute phase of infection. The lowest  concentration of SARS-CoV-2 viral copies this assay can detect is 250  copies / mL. A negative result does not preclude SARS-CoV-2 infection  and should not be used as the sole basis for treatment or other  patient management decisions.  A  negative result may occur with  improper specimen collection / handling, submission of specimen other  than nasopharyngeal swab, presence of viral mutation(s) within the  areas targeted by this assay, and inadequate number of viral copies  (<250 copies / mL). A negative result must be combined with clinical  observations, patient history, and epidemiological information. If result is POSITIVE SARS-CoV-2 target nucleic acids are DETECTED.  The SARS-CoV-2 RNA is generally detectable in upper and lower  respiratory specimens during the acute phase of infection.  Positive  results are indicative of active infection with SARS-CoV-2.  Clinical  correlation with patient history and other diagnostic information is  necessary to determine patient infection status.  Positive results do  not rule out bacterial infection or co-infection with other viruses. If result is PRESUMPTIVE POSTIVE SARS-CoV-2 nucleic acids MAY BE PRESENT.   A presumptive positive result was obtained on the submitted specimen  and confirmed on repeat testing.  While 2019 novel coronavirus  (SARS-CoV-2) nucleic acids may be present in the submitted sample  additional confirmatory testing may be necessary for epidemiological  and / or clinical management purposes  to differentiate between  SARS-CoV-2 and other Sarbecovirus currently known to infect humans.  If clinically indicated additional testing with an alternate test  methodology (  [id number])  is advised. The SARS-CoV-2 RNA is generally  detectable in upper and lower respiratory specimens during the acute  phase of infection. The expected result is Negative. Fact Sheet for Patients:  BoilerBrush.com.cy Fact Sheet for Healthcare Providers: https://pope.com/ This test is not yet approved or cleared by the [place] FDA and has been authorized for detection and/or diagnosis of SARS-CoV-2 by FDA under an Emergency Use  Authorization (EUA).  This EUA will remain in effect (meaning this test can be used) for the duration of the COVID-19 declaration under Section 564(b)(1) of the Act, 21 U.S.C. section 360bbb-3(b)(1), unless the authorization is terminated or revoked sooner. Performed at [facility], [address], [place], [place] [address]   Urine Culture     Status: Abnormal   Collection Time: 09/14/2018  9:30 PM  Result Value Ref Range Status   Specimen Description   Final    URINE, RANDOM Performed at [facility], [address] [place], [place] [address]    Special Requests   Final    NONE Performed at [facility], [address] [place], [place] [address]    Culture MULTIPLE SPECIES PRESENT, SUGGEST RECOLLECTION (A)  Final   Report Status 08/31/2018 FINAL  Final     Labs: Basic Metabolic Panel: Recent Labs  Lab 08/30/18 0600 08/31/18 0402 09/01/18 0445 09/02/18 0440 09/03/18 0400  NA 138 139 139 140 140  K 4.0 4.0 3.5 3.9 4.5  CL 105 107 102 100 103  CO2 23 25 25 25 23   GLUCOSE 119* 99 151* 124* 124*  BUN 10 13 20  30* 43*  CREATININE 1.00 0.86 0.95 1.10 1.47*  CALCIUM 7.5* 7.6* 8.0* 8.3* 8.2*  MG 1.9 2.3 2.0 2.2  --    Liver Function Tests: Recent Labs  Lab 08/30/18 0600 08/31/18 0402 09/01/18 0445 09/02/18 0440 09/03/18 0400  AST 43* 54* 68* 78* 154*  ALT 28 31 47* 54* 133*  ALKPHOS 49 45 54 63 71  BILITOT 0.6 0.5 0.7 0.8 0.8  PROT 7.6 6.9 7.7 7.9 7.7  ALBUMIN 3.3* 2.9* 3.4* 3.6 3.7   No results for input(s): LIPASE, AMYLASE in the last 168 hours. No results for input(s): AMMONIA in the last 168 hours. CBC: Recent Labs  Lab 09-14-18 1114 08/30/18 0600 08/31/18 0402 09/01/18 0445 09/02/18 0440  WBC 8.3 7.4 3.6* 4.2 14.9*  NEUTROABS 6.6 5.4 2.0 3.7 12.8*  HGB 13.3 11.5* 11.2* 11.8* 11.9*  HCT 39.0 35.1* 33.2* 34.9* 34.6*  MCV 94.9 95.9 94.9 93.1 93.0  PLT 161 170 209 279 381   Cardiac Enzymes:  No results for input(s): CKTOTAL, CKMB, CKMBINDEX, TROPONINI in the last 168 hours. D-Dimer Recent Labs    09/01/18 0445 09/02/18 0440  DDIMER 1.10* 0.90*   BNP: Invalid input(s): POCBNP CBG: Recent Labs  Lab 09/02/18 1709 09/02/18 2034 09/03/18 1145 09/03/18 1718 09/03/18 2117  GLUCAP 129* 119* 139* 136* 129*   Anemia work up Recent Labs    09/01/18 0445 09/02/18 0440  FERRITIN 588* 577*   Urinalysis    Component Value Date/Time   COLORURINE YELLOW Sep 14, 2018 2130   APPEARANCEUR CLEAR 09/14/2018 2130   LABSPEC 1.014 09/14/2018 2130   PHURINE 7.0 14-Sep-2018 2130   GLUCOSEU NEGATIVE Sep 14, 2018 2130   HGBUR SMALL (A) 09-14-18 2130   BILIRUBINUR NEGATIVE 09-14-2018 2130   KETONESUR NEGATIVE 09/14/18 2130   PROTEINUR 30 (A) Sep 14, 2018 2130   NITRITE NEGATIVE 09/14/2018 2130   LEUKOCYTESUR NEGATIVE 14-Sep-2018  2130   Sepsis Labs Invalid input(s): PROCALCITONIN,  WBC,  LACTICIDVEN     SIGNED:  Hollice EspySendil K Yasmine Kilbourne, MD  [facility] 08/27/2018, 4:19 AM Pager   If 7PM-7AM, please contact night-coverage [email or web address] Password [email or web address]

## 2018-09-25 NOTE — Progress Notes (Signed)
ABG's obtained awaiting results

## 2018-09-25 DEATH — deceased

## 2020-06-09 IMAGING — DX CHEST  1 VIEW
1 series · 2 of 2 positions shown · non-contrast
Comparison: Chest radiograph dated 08/29/2018

CLINICAL DATA: 86-year-old male with respiratory abnormality. Low
saturation.

EXAM:
CHEST  1 VIEW

[Series 1: chest · 0.14mm/px · 2 of 2 slices shown]
[im 1/2]
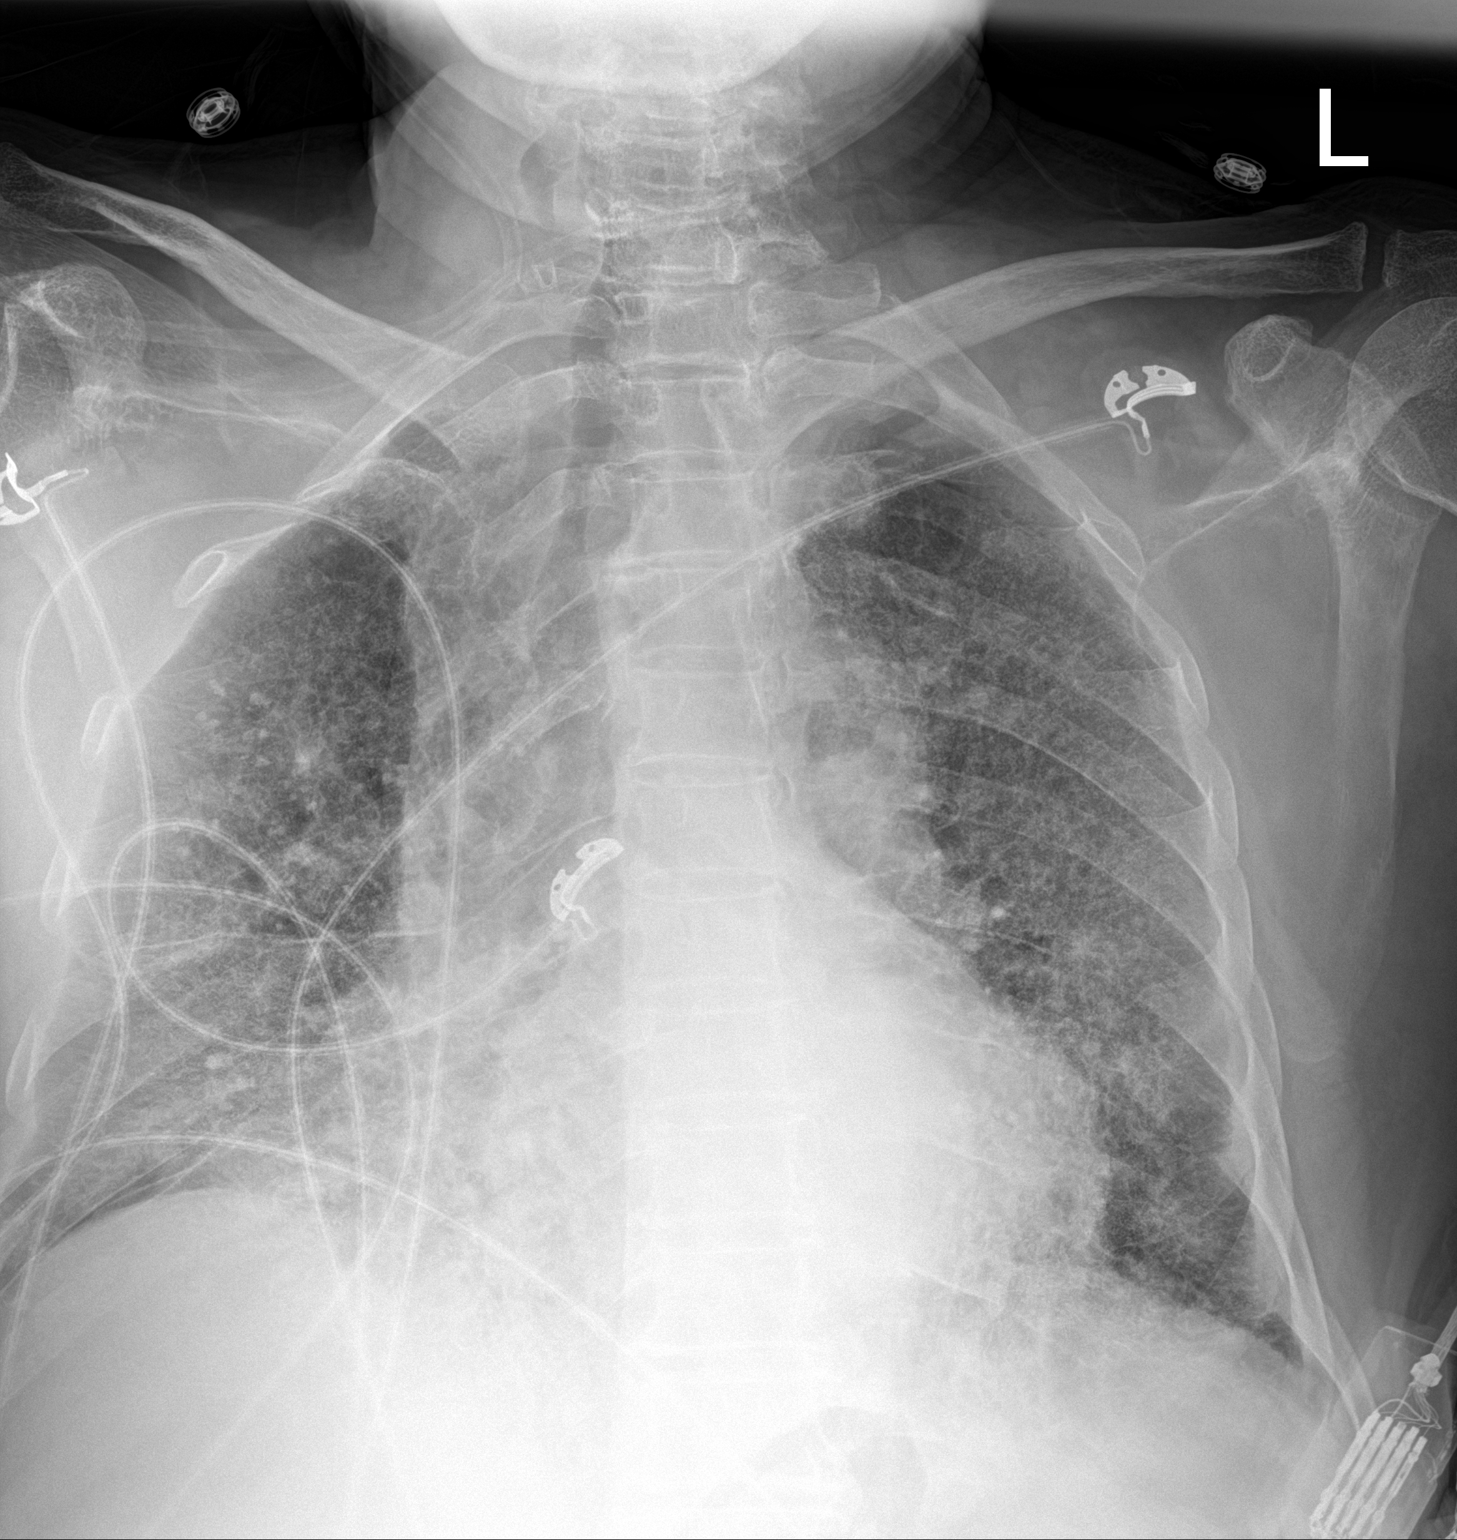
[im 2/2]
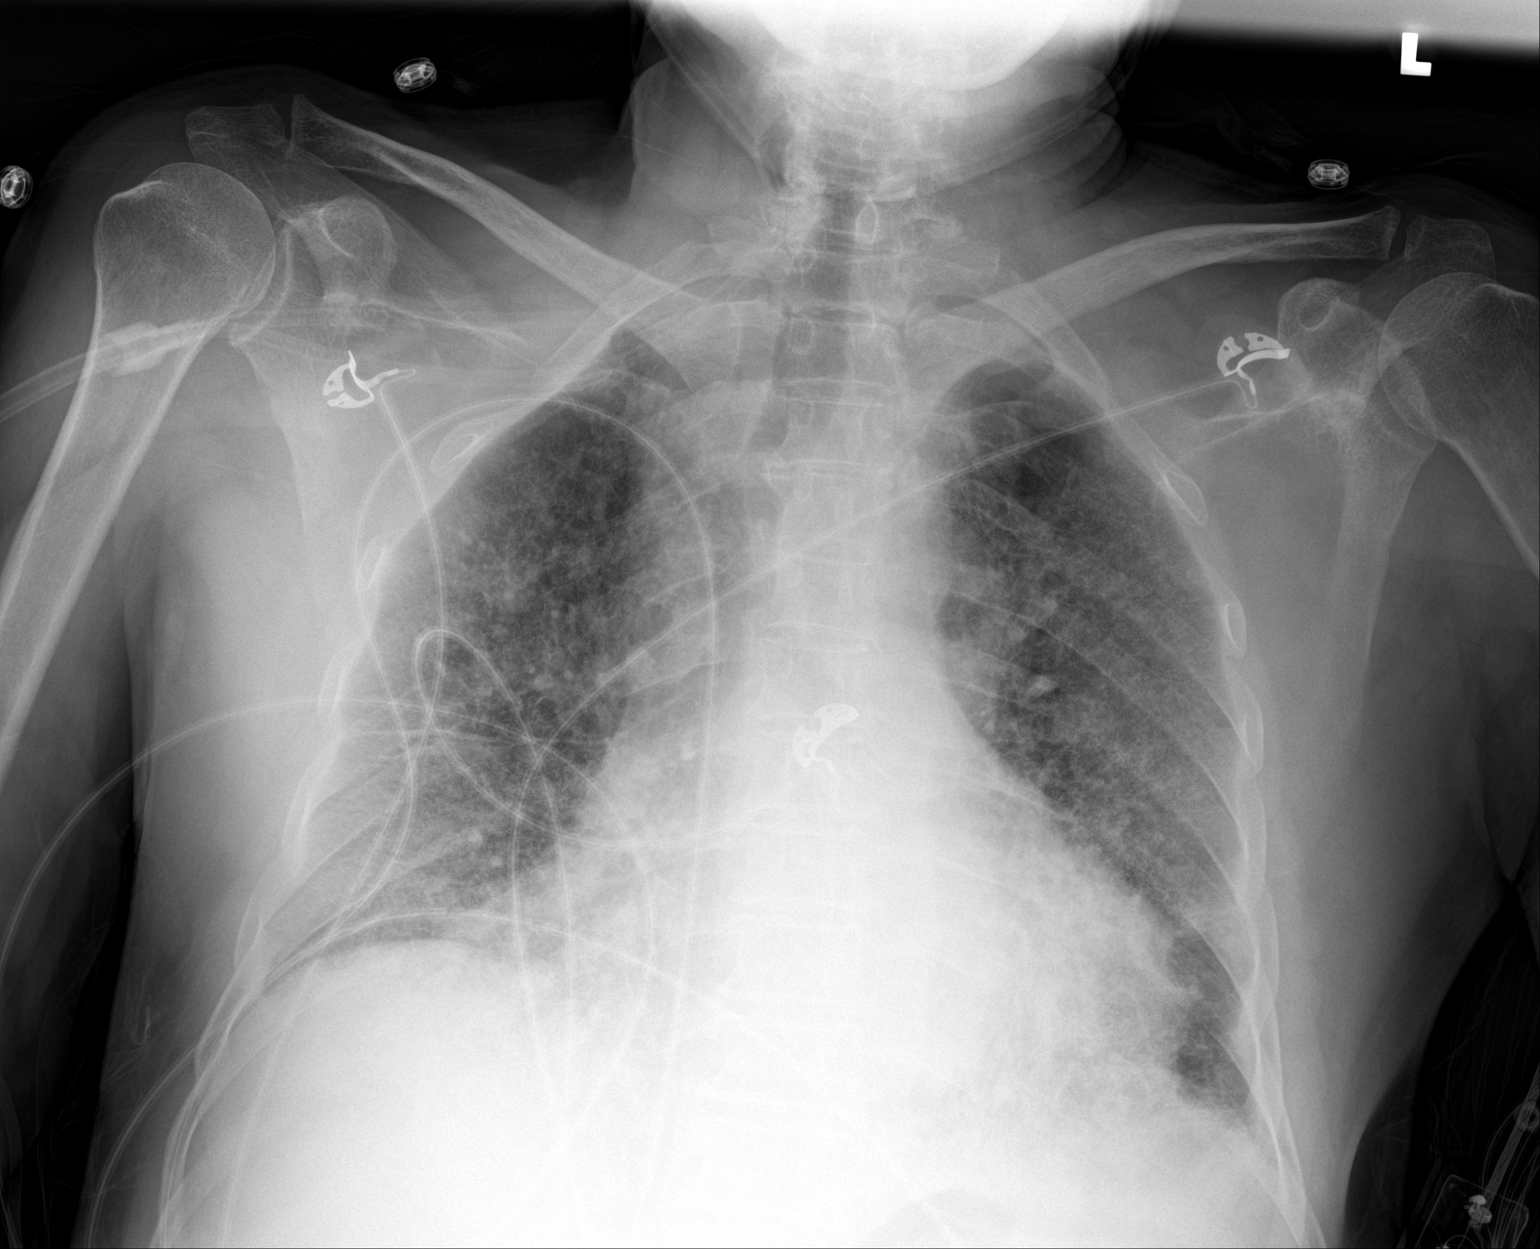

[2 of 2 positions shown; findings below may reference images not displayed]

FINDINGS: Diffuse interstitial coarsening and prominence with interval
worsening since the study August 29, 2018 represent edema or
atypical infection. Clinical correlation is recommended. No pleural
effusion or pneumothorax. The right costophrenic angle has been
excluded from the image. There is cardiomegaly. No acute osseous
pathology.
IMPRESSION: Overall interval worsening of the diffuse interstitial densities
which may represent edema or atypical infection.
